# Patient Record
Sex: Female | Born: 1970 | Race: White | Hispanic: No | Marital: Married | Smoking: Never smoker
Health system: Southern US, Community
[De-identification: ages and names within clinical notes are randomized; demographics above are authoritative.]

## PROBLEM LIST (undated history)

## (undated) DIAGNOSIS — D649 Anemia, unspecified: Secondary | ICD-10-CM

## (undated) DIAGNOSIS — T7840XA Allergy, unspecified, initial encounter: Secondary | ICD-10-CM

## (undated) DIAGNOSIS — J309 Allergic rhinitis, unspecified: Secondary | ICD-10-CM

## (undated) HISTORY — DX: Allergic rhinitis, unspecified: J30.9

## (undated) HISTORY — DX: Anemia, unspecified: D64.9

## (undated) HISTORY — DX: Allergy, unspecified, initial encounter: T78.40XA

---

## 1992-08-26 HISTORY — PX: BREAST ENHANCEMENT SURGERY: SHX7

## 2005-11-06 ENCOUNTER — Other Ambulatory Visit: Admission: RE | Admit: 2005-11-06 | Discharge: 2005-11-06 | Payer: Self-pay | Admitting: Internal Medicine

## 2007-02-26 ENCOUNTER — Observation Stay (HOSPITAL_COMMUNITY): Admission: RE | Admit: 2007-02-26 | Discharge: 2007-02-26 | Payer: Self-pay | Admitting: Obstetrics and Gynecology

## 2007-03-07 ENCOUNTER — Inpatient Hospital Stay (HOSPITAL_COMMUNITY): Admission: RE | Admit: 2007-03-07 | Discharge: 2007-03-09 | Payer: Self-pay | Admitting: Obstetrics and Gynecology

## 2007-03-07 ENCOUNTER — Encounter (INDEPENDENT_AMBULATORY_CARE_PROVIDER_SITE_OTHER): Payer: Self-pay | Admitting: Obstetrics and Gynecology

## 2007-05-06 ENCOUNTER — Encounter: Admission: RE | Admit: 2007-05-06 | Discharge: 2007-05-06 | Payer: Self-pay | Admitting: Obstetrics and Gynecology

## 2008-02-15 ENCOUNTER — Encounter: Admission: RE | Admit: 2008-02-15 | Discharge: 2008-02-15 | Payer: Self-pay | Admitting: Obstetrics and Gynecology

## 2008-07-15 ENCOUNTER — Ambulatory Visit (HOSPITAL_COMMUNITY): Admission: RE | Admit: 2008-07-15 | Discharge: 2008-07-15 | Payer: Self-pay | Admitting: Specialist

## 2008-12-27 ENCOUNTER — Ambulatory Visit (HOSPITAL_COMMUNITY): Admission: RE | Admit: 2008-12-27 | Discharge: 2008-12-27 | Payer: Self-pay | Admitting: Gynecology

## 2009-02-22 ENCOUNTER — Encounter: Admission: RE | Admit: 2009-02-22 | Discharge: 2009-02-22 | Payer: Self-pay | Admitting: *Deleted

## 2010-01-05 ENCOUNTER — Ambulatory Visit: Payer: Self-pay | Admitting: Obstetrics and Gynecology

## 2010-01-12 ENCOUNTER — Ambulatory Visit: Payer: Self-pay | Admitting: Obstetrics & Gynecology

## 2010-01-19 ENCOUNTER — Ambulatory Visit: Payer: Self-pay | Admitting: Obstetrics & Gynecology

## 2010-01-26 ENCOUNTER — Inpatient Hospital Stay (HOSPITAL_COMMUNITY): Admission: AD | Admit: 2010-01-26 | Discharge: 2010-01-29 | Payer: Self-pay | Admitting: Obstetrics and Gynecology

## 2010-01-26 ENCOUNTER — Encounter (INDEPENDENT_AMBULATORY_CARE_PROVIDER_SITE_OTHER): Payer: Self-pay | Admitting: Obstetrics and Gynecology

## 2010-01-26 ENCOUNTER — Ambulatory Visit: Payer: Self-pay | Admitting: Obstetrics and Gynecology

## 2010-02-23 LAB — CONVERTED CEMR LAB: Pap Smear: NORMAL

## 2010-07-11 ENCOUNTER — Ambulatory Visit: Payer: Self-pay | Admitting: Family Medicine

## 2010-07-11 DIAGNOSIS — J309 Allergic rhinitis, unspecified: Secondary | ICD-10-CM | POA: Insufficient documentation

## 2010-07-11 DIAGNOSIS — J209 Acute bronchitis, unspecified: Secondary | ICD-10-CM | POA: Insufficient documentation

## 2010-07-11 HISTORY — DX: Allergic rhinitis, unspecified: J30.9

## 2010-07-17 ENCOUNTER — Telehealth: Payer: Self-pay | Admitting: Family Medicine

## 2010-09-12 ENCOUNTER — Telehealth: Payer: Self-pay | Admitting: Family Medicine

## 2010-09-17 ENCOUNTER — Encounter: Payer: Self-pay | Admitting: Gynecology

## 2010-09-25 NOTE — Assessment & Plan Note (Signed)
Summary: new acute only---bronchitis//ccm   Vital Signs:  Patient profile:   40 year old female Menstrual status:  irregular LMP:     04/26/2009 Height:      63.75 inches Weight:      114 pounds BMI:     19.79 Temp:     98.3 degrees F oral Pulse rate:   72 / minute Pulse rhythm:   regular Resp:     12 per minute BP sitting:   100 / 58  (left arm) Cuff size:   regular  Vitals Entered By: Sid Falcon LPN (July 11, 2010 2:27 PM) LMP (date): 04/26/2009     Menstrual Status irregular Enter LMP: 04/26/2009 Last PAP Result normal   History of Present Illness: Patient here as a new patient to establish care. Here with acute issue 2 week history of bronchitis symptoms. Cough occasionally productive of greenish sputum. Has taken some Mucinex. Has history of mild intermittent asthma. Question of some wheezing. Also has some sinus pressure maxillary sinus region. No fever. Has used Nasonex in the past for seasonal allergies.  She takesover-the-counter medications and supplements and birth control pill. No known allergies.  Family history is that of her father having stroke history as well as hypertension and laryngeal cancer.  Preventive Screening-Counseling & Management  Alcohol-Tobacco     Smoking Status: never  Caffeine-Diet-Exercise     Does Patient Exercise: yes  Allergies (verified): No Known Drug Allergies  Past History:  Family History: Last updated: 07/11/2010 Family History of Alcoholism/Addiction, grandparent colon polyps, parents Family History High cholesterol Family History Hypertension  father Family History of Stroke   father Laryngeal CA father  Social History: Last updated: 07/11/2010 Occupation:  Automotive engineer Married Never Smoked Alcohol use-yes Regular exercise-yes 2 pregnancies 3 live births, (twins)  Risk Factors: Exercise: yes (07/11/2010)  Risk Factors: Smoking Status: never (07/11/2010)  Past Medical  History: anemia asthma hx Hay fever, allergies  Past Surgical History: Caesarean section 2008, 2011 Breast implants 1994 PMH-FH-SH reviewed for relevance  Family History: Family History of Alcoholism/Addiction, grandparent colon polyps, parents Family History High cholesterol Family History Hypertension  father Family History of Stroke   father Laryngeal CA father  Social History: Occupation:  Automotive engineer Married Never Smoked Alcohol use-yes Regular exercise-yes 2 pregnancies 3 live births, (twins) Smoking Status:  never Occupation:  employed Does Patient Exercise:  yes  Review of Systems      See HPI  Physical Exam  General:  Well-developed,well-nourished,in no acute distress; alert,appropriate and cooperative throughout examination Ears:  External ear exam shows no significant lesions or deformities.  Otoscopic examination reveals clear canals, tympanic membranes are intact bilaterally without bulging, retraction, inflammation or discharge. Hearing is grossly normal bilaterally. Mouth:  Oral mucosa and oropharynx without lesions or exudates.  Teeth in good repair. Neck:  No deformities, masses, or tenderness noted. Lungs:  patient has some mild diffuse expiratory wheezes in both lung bases. No rales. No retractions Heart:  Normal rate and regular rhythm. S1 and S2 normal without gallop, murmur, click, rub or other extra sounds. Extremities:  No clubbing, cyanosis, edema, or deformity noted with normal full range of motion of all joints.     Impression & Recommendations:  Problem # 1:  ACUTE BRONCHITIS (ICD-466.0) probably viral.  refill proventil.  consider brief pred taper and antibiotic if no better in several days. Her updated medication list for this problem includes:    Proventil Hfa 108 (90 Base) Mcg/act Aers (Albuterol sulfate) .Marland KitchenMarland KitchenMarland KitchenMarland Kitchen 2  sprays q 4 hours as needed cough and wheeze  Problem # 2:  ALLERGIC RHINITIS (ICD-477.9) refill Nasonex. Her  updated medication list for this problem includes:    Nasonex 50 Mcg/act Susp (Mometasone furoate) .Marland Kitchen... 2 sprays per nostril once daily as needed  Complete Medication List: 1)  One Daily Womens Tabs (Multiple vitamins-minerals) .... Once daily 2)  Fish Oil 1000 Mg Caps (Omega-3 fatty acids) .... Once daily 3)  Vitamin D 400 Unit Caps (Cholecalciferol) .... Once daily 4)  Birth Control Pil  5)  Nasonex 50 Mcg/act Susp (Mometasone furoate) .... 2 sprays per nostril once daily as needed 6)  Proventil Hfa 108 (90 Base) Mcg/act Aers (Albuterol sulfate) .... 2 sprays q 4 hours as needed cough and wheeze  Patient Instructions: 1)  Acute Bronchitis symptoms for less then 10 days are not  helped by antibiotics. Take over the counter cough medications. Call if no improvement in 5-7 days, sooner if increasing cough, fever, or new symptoms ( shortness of breath, chest pain) .  2)  Continue Mucinex and Netti pot. Prescriptions: PROVENTIL HFA 108 (90 BASE) MCG/ACT AERS (ALBUTEROL SULFATE) 2 sprays q 4 hours as needed cough and wheeze  #1 x 2   Entered and Authorized by:   Evelena Peat MD   Signed by:   Evelena Peat MD on 07/11/2010   Method used:   Electronically to        [facility] [phone]* (retail)       [address]       [place], [place]  [address]       Ph: [phone] or [phone]       Fax: [phone]   RxID:   [id number] NASONEX 50 MCG/ACT SUSP (MOMETASONE FUROATE) 2 sprays per nostril once daily as needed  #1 x 11   Entered and Authorized by:   Evelena Peat MD   Signed by:   Evelena Peat MD on 07/11/2010   Method used:   Electronically to        [facility] [phone]* (retail)       [address]       [place], [place]  [address]       Ph: [phone] or [phone]       Fax: [phone]   RxID:   [id number]    Orders Added: 1)  New Patient Level II [99202]    Preventive Care  Screening  Last Tetanus Booster:    Date:  02/23/2010    Results:  Td   Pap Smear:    Date:  02/23/2010    Results:  normal

## 2010-09-25 NOTE — Progress Notes (Signed)
Summary: zpack request  Phone Note Call from Patient Call back at Home Phone [phone]   Caller: Patient Call For: Evelena Peat MD Summary of Call: Pt was seen on 07-11-2010.She  is no better, pt is requesting a zpack(bronchitis) [facility] [address] [phone] Initial call taken by: Heron Sabins,  July 17, 2010 9:01 AM  Follow-up for Phone Call        OK to call in Z-pack use as directed. Follow-up by: Evelena Peat MD,  July 17, 2010 9:04 AM  Additional Follow-up for Phone Call Additional follow up Details #1::        Pt informed Additional Follow-up by: Sid Falcon LPN,  July 17, 2010 11:13 AM    New/Updated Medications: ZITHROMAX Z-PAK 250 MG TABS (AZITHROMYCIN) use as directed Prescriptions: ZITHROMAX Z-PAK 250 MG TABS (AZITHROMYCIN) use as directed  #6 x 0   Entered by:   Sid Falcon LPN   Authorized by:   Evelena Peat MD   Signed by:   Sid Falcon LPN on 62/95/2841   Method used:   Electronically to        [facility] [phone]* (retail)       [address]       [place], [place]  [address]       Ph: [phone] or [phone]       Fax: [phone]   RxID:   [id number]

## 2010-09-27 NOTE — Progress Notes (Signed)
  Phone Note Call from Patient   Caller: Patient Call For: Evelena Peat MD Summary of Call: [facility] 90 day supply of Flovent [phone] used old one and it worked well, would like it called in. Initial call taken by: Lynann Beaver CMA AAMA,  September 12, 2010 4:34 PM  Follow-up for Phone Call        need to know dose-44,88,etc  [address] CMA AAMA  September 13, 2010 8:27 AM  Follow-up by: Evelena Peat MD,  September 12, 2010 5:17 PM  Additional Follow-up for Phone Call Additional follow up Details #1::        Flovent 44 micrograms 2 puffs two times a day refil times 6 Additional Follow-up by: Evelena Peat MD,  September 13, 2010 1:16 PM    New/Updated Medications: FLOVENT HFA 44 MCG/ACT AERO (FLUTICASONE PROPIONATE  HFA) 2 puffs bid Prescriptions: FLOVENT HFA 44 MCG/ACT AERO (FLUTICASONE PROPIONATE  HFA) 2 puffs bid  #3 x 3   Entered by:   Lynann Beaver CMA AAMA   Authorized by:   Evelena Peat MD   Signed by:   Lynann Beaver CMA AAMA on 09/13/2010   Method used:   Printed then faxed to ...       [facility] (mail-order)       [address] [place], [place]  [address]       Ph: [phone]       Fax: [phone]   RxID:   [id number]

## 2010-11-12 LAB — CBC
Hemoglobin: 12.5 g/dL (ref 12.0–15.0)
MCHC: 35.5 g/dL (ref 30.0–36.0)
MCHC: 35.5 g/dL (ref 30.0–36.0)
MCV: 100.7 fL — ABNORMAL HIGH (ref 78.0–100.0)
RBC: 3.5 MIL/uL — ABNORMAL LOW (ref 3.87–5.11)
RDW: 14.1 % (ref 11.5–15.5)
RDW: 14.3 % (ref 11.5–15.5)
WBC: 8.5 10*3/uL (ref 4.0–10.5)

## 2010-11-12 LAB — DIC (DISSEMINATED INTRAVASCULAR COAGULATION)PANEL
D-Dimer, Quant: >20 ug/mL-FEU — ABNORMAL HIGH (ref 0.00–0.48)
Smear Review: NONE SEEN
aPTT: 29 seconds (ref 24–37)

## 2010-11-12 LAB — RPR: RPR Ser Ql: NONREACTIVE

## 2010-12-27 ENCOUNTER — Ambulatory Visit: Payer: Self-pay | Admitting: Family Medicine

## 2010-12-31 ENCOUNTER — Encounter: Payer: Self-pay | Admitting: Family Medicine

## 2010-12-31 ENCOUNTER — Ambulatory Visit (INDEPENDENT_AMBULATORY_CARE_PROVIDER_SITE_OTHER): Payer: 59 | Admitting: Family Medicine

## 2010-12-31 VITALS — BP 110/60 | Temp 98.0°F | Ht 64.0 in | Wt 115.0 lb

## 2010-12-31 DIAGNOSIS — R05 Cough: Secondary | ICD-10-CM

## 2010-12-31 DIAGNOSIS — J309 Allergic rhinitis, unspecified: Secondary | ICD-10-CM

## 2010-12-31 DIAGNOSIS — R059 Cough, unspecified: Secondary | ICD-10-CM

## 2010-12-31 MED ORDER — MONTELUKAST SODIUM 10 MG PO TABS: 10.0000 mg | ORAL_TABLET | Freq: Every day | ORAL | Status: DC

## 2010-12-31 NOTE — Progress Notes (Signed)
  Subjective:    Patient ID: Tasha Brooks, female    DOB: 09-14-1970, 40 y.o.   MRN: [id number]  HPI Patient seen with a cough almost 2 month duration. Nonsmoker. History of asthma which apparently has been mild and intermittent. Started back Flovent 44 mcg one puff twice daily about 6 weeks ago. Some postnasal drip symptoms. Using Nasonex and occasional Claritin-D. Denies dyspnea. No chest pain. No pleuritic pain. Denies hemoptysis, fever, or chills. No GERD symptoms. No other prescription medications.   Review of Systems  Constitutional: Negative for fever, chills, activity change, appetite change, fatigue and unexpected weight change.  Respiratory: Positive for cough and wheezing. Negative for shortness of breath and stridor.   Cardiovascular: Negative for chest pain, palpitations and leg swelling.       Objective:   Physical Exam  Constitutional: She appears well-developed and well-nourished.  HENT:  Right Ear: External ear normal.  Left Ear: External ear normal.  Mouth/Throat: Oropharynx is clear and moist. No oropharyngeal exudate.  Neck: Neck supple. No thyromegaly present.  Cardiovascular: Normal rate, regular rhythm and normal heart sounds.   Pulmonary/Chest: Effort normal and breath sounds normal. No respiratory distress. She has no wheezes. She has no rales.  Musculoskeletal: She exhibits no edema.  Lymphadenopathy:    She has no cervical adenopathy.          Assessment & Plan:  Cough probably secondary to allergic postnasal drip and/or asthma. Titrate Flovent 44 mcg 2 puffs twice daily. Add Singulair 10 mg daily. Continue Nasonex and over-the-counter antihistamine. Consider chest x-ray if no better in 2 weeks

## 2010-12-31 NOTE — Patient Instructions (Signed)
Increase Flovent to 2 puffs twice daily. Be in touch in 2 weeks if no better.

## 2011-01-08 NOTE — Op Note (Signed)
NAMEGLADIS, Tasha Brooks              ACCOUNT NO.:  [id number]   MEDICAL RECORD NO.:  [id number]          PATIENT TYPE:  INP   LOCATION:  9113                          FACILITY:  [facility]   PHYSICIAN:  Maxie Better, M.D.DATE OF BIRTH:  July 04, 1971   DATE OF PROCEDURE:  03/07/2007  DATE OF DISCHARGE:                               OPERATIVE REPORT   PREOPERATIVE DIAGNOSIS:  Breech presentation, term gestation.   PROCEDURES:  primary cesarean section, Kerr hysterotomy.   POSTOPERATIVE DIAGNOSIS:  Incomplete breech presentation, term  gestation.   ANESTHESIA:  Spinal.   SURGEON:  Maxie Better, M.D.   ASSISTANT:  None.   INDICATIONS:  40 year old gravida 1, para 0 female at term with a breech  presentation now presents for primary cesarean section.  The patient had  a successful external cephalic version on July 3, and subsequently  converted back to a breech presentation.  The patient now presents for  surgical management.  Risk and benefit procedure was explained to the  patient and husband, consent had been signed, the patient was  transferred to the operating room.   PROCEDURE:  Under adequate spinal anesthesia the patient is placed in  the supine position with a left lateral tilt.  She was sterilely prepped  and draped in usual fashion.  Indwelling Foley catheter was sterilely  placed.  Quarter percent Marcaine was injected along planned  Pfannenstiel skin incision.  Pfannenstiel skin incision was then made,  carried down to the rectus fascia.  Rectus fascia was opened  transversely.  The rectus fascia was then bluntly and sharply dissected  off the rectus muscle in superior inferior fashion.  The rectus muscles  split in midline.  The parietal peritoneum entered bluntly and extended.  The vesicouterine peritoneum was opened transversely.  The bladder was  bluntly dissected off the lower uterine segment and displaced  inferiorly.  The bladder retractor.  A curvilinear low  transverse  uterine incision was then made and extended with bandage scissors.  Artificial rupture of membranes.  Copious clear fluid was noted, fetus  with back up incomplete presentation was delivered using the usual  breech maneuver. The baby was bulb suctioned on abdomen and the cord was  clamped, cut. The baby was transferred to the awaiting pediatrician  assigned Apgars of 9 and 10 at 1 and 5 minutes.  The placenta was  manually removed sent to pathology.  Uterine cavity was cleaned of  debris.  Uterine incision had no extension and was closed in two layers,  first layer 0 Monocryl running lock stitch second layer was imbricated 0  Monocryl suture.  The uterine contour was normal on inspection, normal  tubes and ovaries were noted bilaterally.  The abdomen was copiously  irrigated, suctioned of debris.  The parietal peritoneum was then closed  with 2-0 Vicryl suture.  The rectus fascia was closed with 0 Vicryl x2.  The subcutaneous areas approximated interrupted 2-0 plain sutures and  skin approximated using Ethicon staples.   SPECIMENS:  Placenta sent to pathology.  Estimated blood loss was 800  mL.  Intraoperative fluid was 2300 mL crystalloid.  Urine output was 150  mL clear yellow urine.  Sponge and instrument counts x2 was correct.  Complications none.  The patient tolerated the procedure well and was  transferred to recovery in stable condition.      Maxie Better, M.D.  Electronically Signed     /MEDQ  D:  03/07/2007  T:  03/08/2007  Job:  [id number]

## 2011-01-11 NOTE — Discharge Summary (Signed)
Tasha Brooks, Tasha Brooks              ACCOUNT NO.:  [id number]   MEDICAL RECORD NO.:  [id number]          PATIENT TYPE:  INP   LOCATION:  9113                          FACILITY:  [facility]   PHYSICIAN:  Maxie Better, M.D.DATE OF BIRTH:  [birth year]   DATE OF ADMISSION:  03/07/2007  DATE OF DISCHARGE:  03/09/2007                               DISCHARGE SUMMARY   ADMISSION DIAGNOSIS:  Breech presentation, term gestation.   DISCHARGE DIAGNOSIS:  Incomplete breech presentation, term gestation  delivered.   PROCEDURE:  Primary cesarean section, Kerr hysterotomy.   HISTORY OF PRESENT ILLNESS:  40 year old gravida 1, para 0 married white  female conceived by in vitro fertilization now at 40 weeks presented for  a primary cesarean section secondary to breech presentation.  The  patient underwent a successful external cephalic version on 07/03,  however, the baby converted back to breech again. The version was  complicated by transient bradycardia which subsequently resolved.   HOSPITAL COURSE:  The patient was admitted to [facility].  She was  taken to the operating room where she underwent a primary low transverse  cesarean section.  The procedure resulted in delivery of a 6 pounds 1  ounce live female, Apgars of 9/10. The patient had an uncomplicated  postoperative course.  Her CBC on postop day #1 showed a hemoglobin of  9.4, hematocrit of 27, white count of 12.3, platelet count of 153.  The  patient requested to go home on postop day #2, she had no evidence of  infection.  Her incision was without erythema, induration, she was  without any symptoms from hemoglobin. The patient was felt to be okay to  be discharged home.  Disposition is home.  Condition stable.   DISCHARGE MEDICATIONS:  1. Ibuprofen 800 mg every 8 hours p.r.n. pain.  2. Percocet tablet one every 4-6 hours p.r.n. pain.  3. Iron tablets one p.o. b.i.d.  4. Prenatal vitamins one p.o. daily.   DISCHARGE INSTRUCTIONS:   Per the postpartum booklet given.   FOLLOW-UP APPOINTMENTS:  At [facility] for staple removal on March 11, 2007 and 6 weeks postpartum visit      Maxie Better, M.D.  Electronically Signed     [place]/MEDQ  D:  04/05/2007  T:  04/05/2007  Job:  [id number]

## 2011-01-24 ENCOUNTER — Encounter: Payer: Self-pay | Admitting: Family Medicine

## 2011-01-24 ENCOUNTER — Ambulatory Visit (INDEPENDENT_AMBULATORY_CARE_PROVIDER_SITE_OTHER): Payer: 59 | Admitting: Family Medicine

## 2011-01-24 VITALS — BP 100/60 | Temp 98.5°F

## 2011-01-24 DIAGNOSIS — J309 Allergic rhinitis, unspecified: Secondary | ICD-10-CM

## 2011-01-24 DIAGNOSIS — R05 Cough: Secondary | ICD-10-CM

## 2011-01-24 DIAGNOSIS — R053 Chronic cough: Secondary | ICD-10-CM

## 2011-01-24 DIAGNOSIS — R059 Cough, unspecified: Secondary | ICD-10-CM

## 2011-01-24 MED ORDER — METHYLPREDNISOLONE ACETATE 80 MG/ML IJ SUSP
80.0000 mg | Freq: Once | INTRAMUSCULAR | Status: AC
  Administered 2011-01-24: 80 mg via INTRAMUSCULAR

## 2011-01-24 MED ORDER — AZELASTINE HCL 0.1 % NA SOLN: 1.0000 | Freq: Two times a day (BID) | NASAL | Status: DC

## 2011-01-24 NOTE — Progress Notes (Signed)
  Subjective:    Patient ID: Tasha Brooks, female    DOB: [birth year], 40 y.o.   MRN: [id number]  HPI Patient seen with persistent cough. Refer to prior note. She is nonsmoker. History of allergies and asthma. Last visit we had her increase Flovent to 2 puffs twice daily and addition of Singulair. Singulair did not seem to help. She does have less productive cough but still some postnasal drip symptoms and dry cough. Still exercising. No dyspnea. Denies any fever, chills, appetite change, weight loss. No GERD symptoms. Albuterol helps temporarily. Also some relief with Claritin-D but had some insomnia.  Not using any mentholated products.   Review of Systems  Constitutional: Negative for fever, chills, appetite change, fatigue and unexpected weight change.  HENT: Positive for postnasal drip. Negative for sore throat, rhinorrhea and voice change.   Respiratory: Positive for cough. Negative for chest tightness, shortness of breath and wheezing.   Cardiovascular: Negative for chest pain, palpitations and leg swelling.  Neurological: Negative for headaches.  Hematological: Negative for adenopathy.       Objective:   Physical Exam  Constitutional: She appears well-developed and well-nourished.  HENT:  Right Ear: External ear normal.  Left Ear: External ear normal.  Mouth/Throat: Oropharynx is clear and moist. No oropharyngeal exudate.  Neck: Neck supple.  Cardiovascular: Normal rate, regular rhythm and normal heart sounds.   No murmur heard. Pulmonary/Chest: Effort normal and breath sounds normal. No respiratory distress. She has no wheezes. She has no rales.  Musculoskeletal: She exhibits no edema.  Lymphadenopathy:    She has no cervical adenopathy.          Assessment & Plan:  Chronic cough. Patient has history of reactive airway problems but no evidence for any wheezing at this time. She has some persistent postnasal drip symptoms. Discontinue Singulair. Continue Nasonex and  Flovent. Add Astelin nasal. Nighttime use of brompheniramine or chlorpheniramine. Depo-Medrol 80 mg IM given. Chest x-ray if no better in one to 2 weeks

## 2011-01-24 NOTE — Patient Instructions (Signed)
Discontinue Singulair Continue Flovent 2 puffs twice daily Continue Nasonex Add Astelin nasal 1 spray per nostril twice daily Consider nighttime use of over-the-counter brompheniramine or chlorpheniramine for postnasal drip symptoms Touch base in 1-2 weeks if cough not improved.

## 2011-01-24 NOTE — Progress Notes (Signed)
Addended by: Melchor Amour on: 01/24/2011 05:07 PM   Modules accepted: Orders

## 2011-03-14 ENCOUNTER — Other Ambulatory Visit (INDEPENDENT_AMBULATORY_CARE_PROVIDER_SITE_OTHER): Payer: 59

## 2011-03-14 DIAGNOSIS — Z0000 Encounter for general adult medical examination without abnormal findings: Secondary | ICD-10-CM

## 2011-03-14 LAB — URINALYSIS, ROUTINE W REFLEX MICROSCOPIC
Bilirubin Urine: NEGATIVE
Ketones, ur: NEGATIVE
Nitrite: NEGATIVE
Specific Gravity, Urine: 1.015 (ref 1.000–1.030)
Urine Glucose: NEGATIVE
Urobilinogen, UA: 0.2 (ref 0.0–1.0)
pH: 6 (ref 5.0–8.0)

## 2011-03-14 LAB — BASIC METABOLIC PANEL
CO2: 28 mEq/L (ref 19–32)
GFR: 72.71 mL/min (ref >60.00)
Glucose, Bld: 90 mg/dL (ref 70–99)

## 2011-03-14 LAB — HEPATIC FUNCTION PANEL
ALT: 20 U/L (ref 0–35)
Bilirubin, Direct: 0.1 mg/dL (ref 0.0–0.3)
Total Bilirubin: 0.8 mg/dL (ref 0.3–1.2)

## 2011-03-14 LAB — CBC WITH DIFFERENTIAL/PLATELET
Basophils Absolute: 0 10*3/uL (ref 0.0–0.1)
Basophils Relative: 0.2 % (ref 0.0–3.0)
Hemoglobin: 13.1 g/dL (ref 12.0–15.0)
Lymphocytes Relative: 28.9 % (ref 12.0–46.0)
MCHC: 34.3 g/dL (ref 30.0–36.0)
MCV: 96.2 fl (ref 78.0–100.0)
Monocytes Absolute: 0.3 10*3/uL (ref 0.1–1.0)
Neutro Abs: 4.2 10*3/uL (ref 1.4–7.7)
Neutrophils Relative %: 65.8 % (ref 43.0–77.0)
RDW: 12.9 % (ref 11.5–14.6)

## 2011-03-14 LAB — LIPID PANEL
Cholesterol: 221 mg/dL — ABNORMAL HIGH (ref 0–200)
Total CHOL/HDL Ratio: 3
Triglycerides: 122 mg/dL (ref 0.0–149.0)

## 2011-03-22 ENCOUNTER — Encounter: Payer: 59 | Admitting: Family Medicine

## 2011-03-22 ENCOUNTER — Encounter: Payer: Self-pay | Admitting: Family Medicine

## 2011-03-22 ENCOUNTER — Ambulatory Visit (INDEPENDENT_AMBULATORY_CARE_PROVIDER_SITE_OTHER): Payer: 59 | Admitting: Family Medicine

## 2011-03-22 VITALS — BP 90/60 | HR 60 | Temp 98.7°F | Resp 12

## 2011-03-22 DIAGNOSIS — Z0000 Encounter for general adult medical examination without abnormal findings: Secondary | ICD-10-CM

## 2011-03-22 NOTE — Progress Notes (Signed)
  Subjective:    Patient ID: Tasha Brooks, female    DOB: 1970/12/28, 40 y.o.   MRN: [id number]  HPI Patient seen for well visit. She sees gynecologist yearly and has appointment next month.  Past medical hsitory reviewed. She has history of mild persistent asthma and allergic rhinitis. Recent chronic cough resolved after getting back on regular use of Flovent. She exercises fairly regularly. Tetanus up-to-date.  Past medical history, social history, and family history reviewed  Past Medical History  Diagnosis Date  . ALLERGIC RHINITIS 07/11/2010  . Anemia   . Asthma    Past Surgical History  Procedure Date  . Cesarean section     2008, 2011  . Breast enhancement surgery 1994    reports that she has never smoked. She does not have any smokeless tobacco history on file. Her alcohol and drug histories not on file. family history includes Cancer in her father; Hypertension in her father; and Stroke in her father. No Known Allergies    Review of Systems  Constitutional: Negative for fever, activity change, appetite change and fatigue.  HENT: Negative for hearing loss, ear pain, sore throat and trouble swallowing.   Eyes: Negative for visual disturbance.  Respiratory: Negative for cough and shortness of breath.   Cardiovascular: Negative for chest pain and palpitations.  Gastrointestinal: Negative for abdominal pain, diarrhea, constipation and blood in stool.  Genitourinary: Negative for dysuria and hematuria.  Musculoskeletal: Negative for myalgias, back pain and arthralgias.  Skin: Negative for rash.  Neurological: Negative for dizziness, syncope and headaches.  Hematological: Negative for adenopathy.  Psychiatric/Behavioral: Negative for confusion and dysphoric mood.       Objective:   Physical Exam  Constitutional: She is oriented to person, place, and time. She appears well-developed and well-nourished.  HENT:  Head: Normocephalic and atraumatic.  Eyes: EOM are normal.  Pupils are equal, round, and reactive to light.  Neck: Normal range of motion. Neck supple. No thyromegaly present.  Cardiovascular: Normal rate, regular rhythm and normal heart sounds.   No murmur heard. Pulmonary/Chest: Breath sounds normal. No respiratory distress. She has no wheezes. She has no rales.  Abdominal: Soft. Bowel sounds are normal. She exhibits no distension and no mass. There is no tenderness. There is no rebound and no guarding.  Genitourinary:       Per gyn  Musculoskeletal: Normal range of motion. She exhibits no edema.  Lymphadenopathy:    She has no cervical adenopathy.  Neurological: She is alert and oriented to person, place, and time. She displays normal reflexes. No cranial nerve deficit.  Skin: No rash noted.  Psychiatric: She has a normal mood and affect. Her behavior is normal. Judgment and thought content normal.          Assessment & Plan:   Healthy 40 year old female. Continue GYN followup. Labs reviewed with patient and all favorable. Continue regular exercise.

## 2011-04-09 ENCOUNTER — Telehealth: Payer: Self-pay | Admitting: Family Medicine

## 2011-04-09 NOTE — Telephone Encounter (Signed)
Detailed message left on VM to please fax over the 2nd page that Insurance Co did not get, we will fill out and fax back asap

## 2011-04-09 NOTE — Telephone Encounter (Signed)
Pt came in for cpx and left a form to complete and send to insurance company. Insurance company only received one page and it was 2. Pt requesting form be re-faxed to insurance company.

## 2011-06-11 LAB — CBC
HCT: 27 — ABNORMAL LOW
HCT: 35.9 — ABNORMAL LOW
Hemoglobin: 12.3
Hemoglobin: 9.4 — ABNORMAL LOW
MCHC: 34.3
MCHC: 34.7
MCV: 98.7
MCV: 98.9
Platelets: 153
RBC: 3.64 — ABNORMAL LOW
RDW: 13.3
WBC: 7.6

## 2011-06-11 LAB — RPR: RPR Ser Ql: NONREACTIVE

## 2011-07-23 ENCOUNTER — Other Ambulatory Visit: Payer: Self-pay | Admitting: Family Medicine

## 2011-10-15 ENCOUNTER — Telehealth: Payer: Self-pay | Admitting: *Deleted

## 2011-10-15 MED ORDER — MOMETASONE FUROATE 50 MCG/ACT NA SUSP: 2.0000 | Freq: Every day | NASAL | Status: AC

## 2011-10-15 NOTE — Telephone Encounter (Signed)
I called pt, she requested 3 inhalers to supply her for 3 months through [facility]

## 2011-10-15 NOTE — Telephone Encounter (Signed)
Pt would like to speak to Seffner about sending her meds to [facility] for 90 day supplies. [phone]. Please call pt first.

## 2011-10-18 ENCOUNTER — Telehealth: Payer: Self-pay | Admitting: *Deleted

## 2011-10-18 MED ORDER — AZITHROMYCIN 250 MG PO TABS: ORAL_TABLET | ORAL | Status: AC

## 2011-10-18 NOTE — Telephone Encounter (Signed)
Notified pt. 

## 2011-10-18 NOTE — Telephone Encounter (Signed)
Go ahead and fill Z-pack (5 days) and need to evaluate if not improving soon,

## 2011-10-18 NOTE — Telephone Encounter (Signed)
Pt has 2 children that are at home on antibiotics for viral illnesses.  She is having sinus symptoms, no fever, non productive cough, and would like to have a Zpack.  Is working today.

## 2011-11-04 ENCOUNTER — Telehealth: Payer: Self-pay | Admitting: Family Medicine

## 2011-11-04 NOTE — Telephone Encounter (Signed)
Needs to be seen

## 2011-11-04 NOTE — Telephone Encounter (Signed)
Pt informed, she will schedule OV

## 2011-11-04 NOTE — Telephone Encounter (Signed)
Pt is still having bronchitis symptoms recently was prescribed a z-pack but did not clear it up pt is hoping to get a stronger prescription

## 2011-11-04 NOTE — Telephone Encounter (Signed)
Pt OV 3/1, finished Z-pack 3/5, "felt a little better after med, then it all came back, still coughing a lot, has been taking mucinex and robutussin and just cannot shake this mess".

## 2011-11-05 ENCOUNTER — Encounter: Payer: Self-pay | Admitting: Family

## 2011-11-05 ENCOUNTER — Ambulatory Visit (INDEPENDENT_AMBULATORY_CARE_PROVIDER_SITE_OTHER): Payer: 59 | Admitting: Family

## 2011-11-05 VITALS — BP 90/60 | Temp 98.6°F | Wt 114.0 lb

## 2011-11-05 DIAGNOSIS — J019 Acute sinusitis, unspecified: Secondary | ICD-10-CM

## 2011-11-05 DIAGNOSIS — R059 Cough, unspecified: Secondary | ICD-10-CM

## 2011-11-05 DIAGNOSIS — R05 Cough: Secondary | ICD-10-CM

## 2011-11-05 MED ORDER — PREDNISONE 20 MG PO TABS: ORAL_TABLET | ORAL | Status: AC

## 2011-11-05 NOTE — Progress Notes (Signed)
Subjective:    Patient ID: Tasha Brooks, female    DOB: [birth year], 41 y.o.   MRN: [id number]  HPI Comments: C/o sinus drainage, productive cough expectorating yellow-green mucus, and hacking cough x 2 months. Was treated March 1 with z-pack with some relief. Denies sorethroat, fever, chills, nausea, vomiting, or headache.   Sinusitis Associated symptoms include congestion. Pertinent negatives include no ear pain, neck pain, sinus pressure, sneezing or sore throat.      Review of Systems  Constitutional: Negative.   HENT: Positive for congestion and postnasal drip. Negative for hearing loss, ear pain, sore throat, facial swelling, sneezing, trouble swallowing, neck pain, neck stiffness, voice change, sinus pressure, tinnitus and ear discharge.   Eyes: Negative.   Respiratory: Negative.   Cardiovascular: Negative.    Past Medical History  Diagnosis Date  . ALLERGIC RHINITIS 07/11/2010  . Anemia   . Asthma     History   Social History  . Marital Status: Married    Spouse Name: N/A    Number of Children: N/A  . Years of Education: N/A   Occupational History  . Not on file.   Social History Main Topics  . Smoking status: Never Smoker   . Smokeless tobacco: Not on file  . Alcohol Use: Not on file  . Drug Use: Not on file  . Sexually Active: Not on file   Other Topics Concern  . Not on file   Social History Narrative  . No narrative on file    Past Surgical History  Procedure Date  . Cesarean section     2008, 2011  . Breast enhancement surgery 1994    Family History  Problem Relation Age of Onset  . Stroke Father   . Hypertension Father   . Cancer Father     laryngeal    No Known Allergies  Current Outpatient Prescriptions on File Prior to Visit  Medication Sig Dispense Refill  . albuterol (PROVENTIL HFA;VENTOLIN HFA) 108 (90 BASE) MCG/ACT inhaler Inhale 2 puffs into the lungs every 6 (six) hours as needed.        Marland Kitchen azelastine (ASTELIN) 137  MCG/SPRAY nasal spray Place 1 spray into the nose 2 (two) times daily. Use in each nostril as directed  30 mL  12  . calcium-vitamin D 250-100 MG-UNIT per tablet Take 1 tablet by mouth 2 (two) times daily.        . fish oil-omega-3 fatty acids 1000 MG capsule Take 2 g by mouth daily.        . fluticasone (FLOVENT HFA) 44 MCG/ACT inhaler Inhale 1 puff into the lungs 2 (two) times daily.        . mometasone (NASONEX) 50 MCG/ACT nasal spray Place 2 sprays into the nose daily.  17 g  3  . Multiple Vitamins-Minerals (WOMENS MULTI PO) Take by mouth daily.        Marland Kitchen SINGULAIR 10 MG tablet         BP 90/60  Temp(Src) 98.6 F (37 C) (Oral)  Wt 114 lb (51.71 kg)chart     Objective:   Physical Exam  Constitutional: She is oriented to person, place, and time. She appears well-developed and well-nourished. No distress.  HENT:  Right Ear: External ear normal.  Left Ear: External ear normal.  Nose: Nose normal.  Mouth/Throat: Oropharyngeal exudate present.  Eyes: Right eye exhibits no discharge. Left eye exhibits no discharge.  Cardiovascular: Normal rate, regular rhythm, normal heart sounds and intact distal pulses.  Exam reveals no friction rub.   No murmur heard. Pulmonary/Chest: Effort normal and breath sounds normal. No respiratory distress. She has no wheezes. She has no rales. She exhibits no tenderness.  Musculoskeletal: Normal range of motion.  Neurological: She is alert and oriented to person, place, and time.  Skin: Skin is warm and dry. She is not diaphoretic.          Assessment & Plan:  Assessment: Sinusitis, Cough  Plan: Rest and increase po fluids, prednisone, teaching handouts provided on diagnosis and medications

## 2011-11-05 NOTE — Patient Instructions (Signed)

## 2011-11-21 ENCOUNTER — Other Ambulatory Visit: Payer: Self-pay | Admitting: Family Medicine

## 2011-11-21 NOTE — Telephone Encounter (Signed)
Pt is requesting new rx  fl[facility]ent [facility] [address] also please sent flovent #3 to [facility]

## 2012-01-22 ENCOUNTER — Other Ambulatory Visit: Payer: Self-pay | Admitting: Obstetrics and Gynecology

## 2012-01-22 DIAGNOSIS — N63 Unspecified lump in unspecified breast: Secondary | ICD-10-CM

## 2012-01-23 ENCOUNTER — Other Ambulatory Visit: Payer: Self-pay

## 2012-01-30 ENCOUNTER — Other Ambulatory Visit: Payer: 59

## 2012-03-09 ENCOUNTER — Telehealth: Payer: Self-pay | Admitting: Family Medicine

## 2012-03-09 MED ORDER — FLUTICASONE PROPIONATE HFA 44 MCG/ACT IN AERO: 2.0000 | INHALATION_SPRAY | Freq: Two times a day (BID) | RESPIRATORY_TRACT | Status: DC

## 2012-03-09 NOTE — Telephone Encounter (Signed)
Pt needs refill on Flovent inhaler. [facility] [address], but it is cheaper through mail  Order [facility]. Please send mail order. Pt's [facility] rx ID# is [id number]. Pt's CPX appt is 04-24-12.

## 2012-03-19 ENCOUNTER — Other Ambulatory Visit: Payer: Self-pay | Admitting: *Deleted

## 2012-03-20 ENCOUNTER — Telehealth: Payer: Self-pay | Admitting: Family Medicine

## 2012-03-20 MED ORDER — FLUTICASONE PROPIONATE HFA 44 MCG/ACT IN AERO: 2.0000 | INHALATION_SPRAY | Freq: Two times a day (BID) | RESPIRATORY_TRACT | Status: DC

## 2012-03-20 NOTE — Telephone Encounter (Signed)
Rx filled as requested.

## 2012-03-20 NOTE — Telephone Encounter (Signed)
Patient called stating that her flovent hfa was called in for a 30 day refill and it should have been called into [facility] for a 90 day supply as this would be a lot cheaper. Please assist.

## 2012-04-17 ENCOUNTER — Other Ambulatory Visit: Payer: 59

## 2012-04-23 ENCOUNTER — Other Ambulatory Visit (INDEPENDENT_AMBULATORY_CARE_PROVIDER_SITE_OTHER): Payer: 59

## 2012-04-23 DIAGNOSIS — Z0000 Encounter for general adult medical examination without abnormal findings: Secondary | ICD-10-CM

## 2012-04-23 LAB — LIPID PANEL
Cholesterol: 172 mg/dL (ref 0–200)
LDL Cholesterol: 85 mg/dL (ref 0–99)
Total CHOL/HDL Ratio: 3
Triglycerides: 97 mg/dL (ref 0.0–149.0)
VLDL: 19.4 mg/dL (ref 0.0–40.0)

## 2012-04-23 LAB — HEPATIC FUNCTION PANEL
Alkaline Phosphatase: 30 U/L — ABNORMAL LOW (ref 39–117)
Bilirubin, Direct: 0.1 mg/dL (ref 0.0–0.3)
Total Bilirubin: 0.7 mg/dL (ref 0.3–1.2)

## 2012-04-23 LAB — POCT URINALYSIS DIPSTICK
Nitrite, UA: NEGATIVE
Protein, UA: NEGATIVE
Spec Grav, UA: 1.01
Urobilinogen, UA: 0.2
pH, UA: 6.5

## 2012-04-23 LAB — BASIC METABOLIC PANEL
BUN: 15 mg/dL (ref 6–23)
Calcium: 8.7 mg/dL (ref 8.4–10.5)
Chloride: 103 mEq/L (ref 96–112)
Creatinine, Ser: 0.8 mg/dL (ref 0.4–1.2)

## 2012-04-23 LAB — CBC WITH DIFFERENTIAL/PLATELET
Eosinophils Absolute: 0 10*3/uL (ref 0.0–0.7)
Eosinophils Relative: 0.8 % (ref 0.0–5.0)
Lymphocytes Relative: 33.1 % (ref 12.0–46.0)
MCHC: 33.1 g/dL (ref 30.0–36.0)
MCV: 96.2 fl (ref 78.0–100.0)
Monocytes Absolute: 0.2 10*3/uL (ref 0.1–1.0)
Neutrophils Relative %: 61.3 % (ref 43.0–77.0)
Platelets: 271 10*3/uL (ref 150.0–400.0)
RBC: 3.58 Mil/uL — ABNORMAL LOW (ref 3.87–5.11)
WBC: 4.1 10*3/uL — ABNORMAL LOW (ref 4.5–10.5)

## 2012-04-24 ENCOUNTER — Encounter: Payer: 59 | Admitting: Family Medicine

## 2012-05-08 ENCOUNTER — Encounter: Payer: Self-pay | Admitting: Family Medicine

## 2012-05-08 ENCOUNTER — Ambulatory Visit (INDEPENDENT_AMBULATORY_CARE_PROVIDER_SITE_OTHER): Payer: 59 | Admitting: Family Medicine

## 2012-05-08 VITALS — BP 120/62 | HR 72 | Temp 98.0°F | Resp 12 | Wt 111.0 lb

## 2012-05-08 DIAGNOSIS — Z23 Encounter for immunization: Secondary | ICD-10-CM

## 2012-05-08 DIAGNOSIS — Z0000 Encounter for general adult medical examination without abnormal findings: Secondary | ICD-10-CM

## 2012-05-08 NOTE — Progress Notes (Signed)
  Subjective:    Patient ID: Tasha Brooks, female    DOB: 11-06-1970, 41 y.o.   MRN: [id number]  HPI  Patient seen for complete physical. She sees gynecologist yearly. She has history of mild persistent asthma. This has been well controlled with Flovent that she has that cost issues with his medication. She will like to explore other options. She has not yet flu vaccine. Tetanus is up-to-date. Exercises regularly. Takes calcium and vitamin D supplements. She also takes nasal steroid for allergies. Allergies have been well controlled. She's never smoked.  Past Medical History  Diagnosis Date  . ALLERGIC RHINITIS 07/11/2010  . Anemia   . Asthma    Past Surgical History  Procedure Date  . Cesarean section     2008, 2011  . Breast enhancement surgery 1994    reports that she has never smoked. She does not have any smokeless tobacco history on file. Her alcohol and drug histories not on file. family history includes Cancer in her father; Hypertension in her father; and Stroke in her father. No Known Allergies    Review of Systems  Constitutional: Negative for fever, activity change, appetite change, fatigue and unexpected weight change.  HENT: Negative for hearing loss, ear pain, sore throat and trouble swallowing.   Eyes: Negative for visual disturbance.  Respiratory: Negative for cough and shortness of breath.   Cardiovascular: Negative for chest pain and palpitations.  Gastrointestinal: Negative for abdominal pain, diarrhea, constipation and blood in stool.  Genitourinary: Negative for dysuria and hematuria.  Musculoskeletal: Negative for myalgias, back pain and arthralgias.  Skin: Negative for rash.  Neurological: Negative for dizziness, syncope and headaches.  Hematological: Negative for adenopathy.  Psychiatric/Behavioral: Negative for confusion, disturbed wake/sleep cycle and dysphoric mood.       Objective:   Physical Exam  Constitutional: She is oriented to person,  place, and time. She appears well-developed and well-nourished.  HENT:  Head: Normocephalic and atraumatic.  Eyes: EOM are normal. Pupils are equal, round, and reactive to light.  Neck: Normal range of motion. Neck supple. No thyromegaly present.  Cardiovascular: Normal rate, regular rhythm and normal heart sounds.   No murmur heard. Pulmonary/Chest: Breath sounds normal. No respiratory distress. She has no wheezes. She has no rales.  Abdominal: Soft. Bowel sounds are normal. She exhibits no distension and no mass. There is no tenderness. There is no rebound and no guarding.  Musculoskeletal: Normal range of motion. She exhibits no edema.  Lymphadenopathy:    She has no cervical adenopathy.  Neurological: She is alert and oriented to person, place, and time. She displays normal reflexes. No cranial nerve deficit.  Skin: No rash noted.       No concerning skin lesions  Psychiatric: She has a normal mood and affect. Her behavior is normal. Judgment and thought content normal.          Assessment & Plan:  Healthy 41 year old female. Labs reviewed. Tetanus up-to-date.

## 2012-05-08 NOTE — Patient Instructions (Addendum)
Consider alternative steroid inhaler based on insurance coverage: Qvar, Pulmicort, Asmanex

## 2012-06-02 ENCOUNTER — Ambulatory Visit (HOSPITAL_COMMUNITY): Payer: 59 | Admitting: Psychiatry

## 2012-06-08 ENCOUNTER — Ambulatory Visit (INDEPENDENT_AMBULATORY_CARE_PROVIDER_SITE_OTHER): Payer: Self-pay | Admitting: Psychiatry

## 2012-06-08 DIAGNOSIS — F4320 Adjustment disorder, unspecified: Secondary | ICD-10-CM

## 2012-06-09 NOTE — Progress Notes (Signed)
   THERAPIST PROGRESS NOTE  Session Time: 4:00-5:00 pm  Participation Level: Active  Behavioral Response: Neat and Well GroomedAlertEuthymic  Type of Therapy: Embriatic Counseling  Treatment Goals addressed: Communication: Embryo Counseling  Interventions: Other: Education and Counseling   Summary: Tasha Brooks is a 41 y.o. female who attended the session with her husband. Both were referred by [facility] for one session of embryatric counseling. Patient and her husband stated they underwent two different IVF treatments and were successful in having three children (one daughter from the first treatment and twins from the second treatment). Both stated they have decided they are done growing their own family and want to donate four of their remaining embryos to a friend of a friend who is currently unable to have children of her own due to not currently being in a relationship of her own. Patients husband agreed to seminate the embryos which will be used in the IVF treatment. The counseling session was suggested by their doctor to ensure they were emotional prepared for this treatment. Both stated they had given a lot of thought to the process and were in agreement that they wanted to proceed with the process. Both were open and engaged in a discussion on both the benefits and challenges regarding the decision to donate. Discrepancies arose during the discussion regarding the level of openings and involvement they each wanted with the mom and possible children and they identified a need for further discussion regarding the amount of involvement they wanted with the children, including the level of involvement they wanted their three current children to have. Patient became tearful at certain points during the conversation when exploring difficult possibilities that could arise due to the complexity of the situation as she realized she already had some attachment to any "unborn  children". Both decided they were exploring questions for the first time that they had not considered and stated they were glad to have the opportunity to discuss this decision at a deeper level. Both agreed to go home and talk further and contact writer if they had more questions or wanted additional sessions.   Suicidal/Homicidal: Nowithout intent/plan  Therapist Response: Provided embryatric counseling, education and supportive therapy. Writer offered additional sessions as they move forward if they wanted additional support with the IVF process.   Plan: This was a one time counseling session. Patient agreed to Higher education careers adviser if she wants to resume sessions.   Diagnosis: Axis I: Adjustment Disorder NOS    Axis II: None    Stepheni Cameron E, LCSW 06/09/2012

## 2012-12-29 ENCOUNTER — Telehealth: Payer: Self-pay | Admitting: Family Medicine

## 2012-12-29 NOTE — Telephone Encounter (Signed)
Patient Information:  Caller Name: Mishika  Phone: [phone]  Patient: Tiahna, Cure  Gender: Female  DOB: 11/10/1970  Age: 42 Years  PCP: Evelena Peat (Family Practice)  Pregnant: No  Office Follow Up:  Does the office need to follow up with this patient?: Yes  Instructions For The Office: Please review, can contact patient at  [phone]   Symptoms  Reason For Call & Symptoms: Cough started Thurs 5/1, coughed up green mucus over the weekend.  Now dry hacking cough, has irritated thorat, yellow drainage down back of throat.  Not slept the last 2 nights due to cough  Reviewed Health History In EMR: Yes  Reviewed Medications In EMR: Yes  Reviewed Allergies In EMR: Yes  Reviewed Surgeries / Procedures: Yes  Date of Onset of Symptoms: 12/24/2012  Treatments Tried: cough drops, Niquil last night  Treatments Tried Worked: No OB / GYN:  LMP: 12/08/2012  Guideline(s) Used:  Cough  Disposition Per Guideline:   See Today or Tomorrow in Office  Reason For Disposition Reached:   Continuous (nonstop) coughing interferes with work or school and no improvement using cough treatment per Care Advice  Advice Given:  Reassurance  Coughing is the way that our lungs remove irritants and mucus. It helps protect our lungs from getting pneumonia.  You can get a dry hacking cough after a chest cold. Sometimes this type of cough can last 1-3 weeks, and be worse at night.  You can also get a cough after being exposed to irritating substances like smoke, strong perfumes, and dust.  Cough Medicines:  OTC Cough Drops: Cough drops can help a lot, especially for mild coughs. They reduce coughing by soothing your irritated throat and removing that tickle sensation in the back of the throat. Cough drops also have the advantage of portability - you can carry them with you.  Coughing Spasms:  Drink warm fluids. Inhale warm mist (Reason: both relax the airway and loosen up the phlegm).  Prevent  Dehydration:  Drink adequate liquids.  Patient Refused Recommendation:  Patient Requests Prescription  Declines Appt today 5/6, schedule too busy. Wanting Rx called to [facility]  [phone].

## 2012-12-29 NOTE — Telephone Encounter (Signed)
Pt will need OV per Dr Caryl Never, please inform pt, OK to see another provider if necessary.  Thank you

## 2012-12-29 NOTE — Telephone Encounter (Signed)
Spoke w/ Tasha Brooks regarding Dr. Lucie Leather instructions. Tasha Brooks states she is too busy to make appt today. States she will try more Nyquil tonight, and if not better will call tomorrow morning to be seen by someone. Encounter closed.

## 2013-04-13 ENCOUNTER — Other Ambulatory Visit: Payer: Self-pay | Admitting: Family Medicine

## 2013-04-14 ENCOUNTER — Telehealth: Payer: Self-pay | Admitting: Family Medicine

## 2013-04-14 NOTE — Telephone Encounter (Signed)
PT is calling to request a 3 month supply of her FLOVENT HFA 44 MCG/ACT inhaler, [facility] [address]. Please assist.

## 2013-04-14 NOTE — Telephone Encounter (Signed)
Patient rx is at the pharmacy and was sent on 04/12/13

## 2013-04-16 ENCOUNTER — Telehealth: Payer: Self-pay | Admitting: Family Medicine

## 2013-04-16 NOTE — Telephone Encounter (Signed)
Patient Information:  Caller Name: Emilio Aspen  Phone: [phone]  Patient: Tasha Brooks, Tasha Brooks  Gender: Female  DOB: October 15, 1970  Age: 42 Years  PCP: Evelena Peat [facility])  Pregnant: No  Office Follow Up:  Does the office need to follow up with this patient?: Yes  Instructions For The Office: Please review -- patient would like the refills sent to [facility] to be sent to [facility]  [phone]  RN Note:  [facility]  [phone].  Rx sent by office on 8/19 is ready for patient to pick up. Patient notified and will pick up Rx.  Would like refills from original Rx to be [facility] to [facility].  Symptoms  Reason For Call & Symptoms: Patient calling about Flovent Inhaler - recent prescription sent to [facility] for 3 inhalers.  Did not realize copay would be 360.00 so electing to get Rx locally per instructions of [facility] since will get one at a time.  Is currently out of Rx.  Reviewed Health History In EMR: Yes  Reviewed Medications In EMR: Yes  Reviewed Allergies In EMR: Yes  Reviewed Surgeries / Procedures: Yes  Date of Onset of Symptoms: 04/16/2013 OB / GYN:  LMP: Unknown  Guideline(s) Used:  No Protocol Available - Information Only  Disposition Per Guideline:   Home Care  Reason For Disposition Reached:   Information only question and nurse able to answer  Advice Given:  N/A  Patient Will Follow Care Advice:  YES

## 2013-04-19 NOTE — Telephone Encounter (Signed)
Pt has picked up rx at the pharmacy

## 2013-04-22 ENCOUNTER — Telehealth: Payer: Self-pay | Admitting: Family Medicine

## 2013-04-22 MED ORDER — FLUTICASONE PROPIONATE 50 MCG/ACT NA SUSP: 2.0000 | Freq: Every day | NASAL | Status: DC

## 2013-04-22 NOTE — Telephone Encounter (Signed)
Pt would like a new rx generic flonase [facility] [phone]

## 2013-04-22 NOTE — Telephone Encounter (Signed)
Change to Flonase 2 sprays per nostril once daily and refill for one year

## 2013-04-22 NOTE — Telephone Encounter (Signed)
Sent to [facility] order

## 2013-04-22 NOTE — Telephone Encounter (Signed)
Pt is on Nasonex but is wanting the Flonase because it has a generic brand

## 2013-05-18 ENCOUNTER — Ambulatory Visit: Payer: 59 | Admitting: Family

## 2013-05-18 ENCOUNTER — Ambulatory Visit (INDEPENDENT_AMBULATORY_CARE_PROVIDER_SITE_OTHER): Payer: 59 | Admitting: Family

## 2013-05-18 ENCOUNTER — Encounter: Payer: Self-pay | Admitting: Family

## 2013-05-18 VITALS — BP 96/60 | HR 64 | Temp 98.3°F | Wt 118.0 lb

## 2013-05-18 DIAGNOSIS — J309 Allergic rhinitis, unspecified: Secondary | ICD-10-CM

## 2013-05-18 DIAGNOSIS — J329 Chronic sinusitis, unspecified: Secondary | ICD-10-CM

## 2013-05-18 MED ORDER — AZITHROMYCIN 250 MG PO TABS: ORAL_TABLET | ORAL | Status: DC

## 2013-05-18 NOTE — Progress Notes (Signed)
Subjective:    Patient ID: Tasha Brooks, female    DOB: Mar 26, 1971, 42 y.o.   MRN: [id number]  HPI  42 year old white female, nonsmoker, patient of Dr. Caryl Never is in today with complaint of sneezing, cough, congestion, sinus pressure ongoing x2 weeks. She had been taken generic Claritin, NyQuil and DayQuil with no relief. However, she recently bought the brand name Claritin and feels like her symptoms are beginning to improve over the last day. She's also been using Flonase. Denies any fever, muscle aches or pain  Review of Systems  Constitutional: Negative.   HENT: Positive for congestion, rhinorrhea and postnasal drip.   Respiratory: Positive for cough. Negative for shortness of breath and wheezing.   Cardiovascular: Negative.   Skin: Negative.   Allergic/Immunologic: Positive for environmental allergies.  Neurological: Negative.   Hematological: Negative.   Psychiatric/Behavioral: Negative.    Past Medical History  Diagnosis Date  . ALLERGIC RHINITIS 07/11/2010  . Anemia   . Asthma     History   Social History  . Marital Status: Married    Spouse Name: N/A    Number of Children: N/A  . Years of Education: N/A   Occupational History  . Not on file.   Social History Main Topics  . Smoking status: Never Smoker   . Smokeless tobacco: Not on file  . Alcohol Use: Not on file  . Drug Use: Not on file  . Sexual Activity: Not on file   Other Topics Concern  . Not on file   Social History Narrative  . No narrative on file    Past Surgical History  Procedure Laterality Date  . Cesarean section      2008, 2011  . Breast enhancement surgery  1994    Family History  Problem Relation Age of Onset  . Stroke Father   . Hypertension Father   . Cancer Father     laryngeal    No Known Allergies  Current Outpatient Prescriptions on File Prior to Visit  Medication Sig Dispense Refill  . albuterol (PROVENTIL HFA;VENTOLIN HFA) 108 (90 BASE) MCG/ACT inhaler Inhale  2 puffs into the lungs every 6 (six) hours as needed.        . calcium-vitamin D 250-100 MG-UNIT per tablet Take 1 tablet by mouth 2 (two) times daily.        . fish oil-omega-3 fatty acids 1000 MG capsule Take 2 g by mouth daily.        Marland Kitchen FLOVENT HFA 44 MCG/ACT inhaler USE 2 INHALATIONS ORALLY   TWICE DAILY  31.8 g  3  . FLOVENT HFA 44 MCG/ACT inhaler INHALE 2 PUFFS TWICE A DAY FOR 14 DAYS  10.6 g  4  . fluticasone (FLONASE) 50 MCG/ACT nasal spray Place 2 sprays into the nose daily.  16 g  3  . JUNEL FE 1/20 1-20 MG-MCG tablet Take 1 tablet by mouth daily.       . mometasone (NASONEX) 50 MCG/ACT nasal spray Place 2 sprays into the nose daily.  17 g  3  . Multiple Vitamins-Minerals (WOMENS MULTI PO) Take by mouth daily.         No current facility-administered medications on file prior to visit.    BP 96/60  Pulse 64  Temp(Src) 98.3 F (36.8 C)  Wt 118 lb (53.524 kg)  BMI 20.24 kg/m2chart    Objective:   Physical Exam  Constitutional: She is oriented to person, place, and time. She appears well-developed and well-nourished.  HENT:  Right Ear: External ear normal.  Left Ear: External ear normal.  Pharynx with moderate erythema. Nasal turbinates pale and boggy  Neck: Normal range of motion. Neck supple.  Cardiovascular: Normal rate, regular rhythm and normal heart sounds.   Pulmonary/Chest: Effort normal and breath sounds normal.  Musculoskeletal: Normal range of motion.  Neurological: She is alert and oriented to person, place, and time.  Skin: Skin is warm and dry.  Psychiatric: She has a normal mood and affect.          Assessment & Plan:  Assessment: 1. Allergic rhinitis 2. Unlikely sinusitis  Plan: Continue Claritin-D and Flonase. If symptoms worsen over the next couple days then fill prescription for Z-Pak as directed. Call the office with any questions or concerns. Recheck as scheduled, and if needed.

## 2013-05-18 NOTE — Patient Instructions (Addendum)
Allergic Rhinitis Allergic rhinitis is when the mucous membranes in the nose respond to allergens. Allergens are particles in the air that cause your body to have an allergic reaction. This causes you to release allergic antibodies. Through a chain of events, these eventually cause you to release histamine into the blood stream (hence the use of antihistamines). Although meant to be protective to the body, it is this release that causes your discomfort, such as frequent sneezing, congestion and an itchy runny nose.  CAUSES  The pollen allergens may come from grasses, trees, and weeds. This is seasonal allergic rhinitis, or "hay fever." Other allergens cause year-round allergic rhinitis (perennial allergic rhinitis) such as house dust mite allergen, pet dander and mold spores.  SYMPTOMS   Nasal stuffiness (congestion).  Runny, itchy nose with sneezing and tearing of the eyes.  There is often an itching of the mouth, eyes and ears. It cannot be cured, but it can be controlled with medications. DIAGNOSIS  If you are unable to determine the offending allergen, skin or blood testing may find it. TREATMENT   Avoid the allergen.  Medications and allergy shots (immunotherapy) can help.  Hay fever may often be treated with antihistamines in pill or nasal spray forms. Antihistamines block the effects of histamine. There are over-the-counter medicines that may help with nasal congestion and swelling around the eyes. Check with your caregiver before taking or giving this medicine. If the treatment above does not work, there are many new medications your caregiver can prescribe. Stronger medications may be used if initial measures are ineffective. Desensitizing injections can be used if medications and avoidance fails. Desensitization is when a patient is given ongoing shots until the body becomes less sensitive to the allergen. Make sure you follow up with your caregiver if problems continue. SEEK MEDICAL  CARE IF:   You develop fever (more than 100.5 F (38.1 C).  You develop a cough that does not stop easily (persistent).  You have shortness of breath.  You start wheezing.  Symptoms interfere with normal daily activities. Document Released: 05/07/2001 Document Revised: 11/04/2011 Document Reviewed: 11/16/2008 Exit[employer] Patient Information 2014 [employer].  

## 2013-06-01 ENCOUNTER — Telehealth: Payer: Self-pay | Admitting: Family Medicine

## 2013-06-01 MED ORDER — ALBUTEROL SULFATE HFA 108 (90 BASE) MCG/ACT IN AERS: 2.0000 | INHALATION_SPRAY | Freq: Four times a day (QID) | RESPIRATORY_TRACT | Status: DC | PRN

## 2013-06-01 NOTE — Telephone Encounter (Signed)
Pt states that her rx of albuterol (PROVENTIL HFA;VENTOLIN HFA) 108 (90 BASE) MCG/ACT inhaler has expired. She would like a new [facility]. Please assist.

## 2013-06-01 NOTE — Telephone Encounter (Signed)
RX sent to pharmacy  

## 2013-06-10 ENCOUNTER — Telehealth: Payer: Self-pay | Admitting: Family Medicine

## 2013-06-10 MED ORDER — ALBUTEROL SULFATE HFA 108 (90 BASE) MCG/ACT IN AERS: 2.0000 | INHALATION_SPRAY | Freq: Four times a day (QID) | RESPIRATORY_TRACT | Status: DC | PRN

## 2013-06-10 NOTE — Telephone Encounter (Signed)
RX sent to [facility]

## 2013-06-10 NOTE — Telephone Encounter (Signed)
Pt states that she is having trouble receiving her albuterol (PROVENTIL HFA;VENTOLIN HFA) 108 (90 BASE) MCG/ACT inhaler RX [facility]. She is requesting that we send a 1 month supply to [facility] on [address]. Please advise.

## 2013-08-03 ENCOUNTER — Telehealth: Payer: Self-pay | Admitting: Family Medicine

## 2013-08-03 NOTE — Telephone Encounter (Signed)
Pt would like a z pac for a cough that she has obtained after a cold.  Pt states that she is having the same symptoms as the last time she saw Padonda on 9/23.  Pt has taken mucinex and delsum and they did not work. Pt is unable to come in and be seen, she is traveling back and forth for work.  Please advise.

## 2013-08-03 NOTE — Telephone Encounter (Signed)
Pt scheduled appt 12/10 4pm

## 2013-08-04 ENCOUNTER — Encounter: Payer: Self-pay | Admitting: Family Medicine

## 2013-08-04 ENCOUNTER — Ambulatory Visit (INDEPENDENT_AMBULATORY_CARE_PROVIDER_SITE_OTHER): Payer: 59 | Admitting: Family Medicine

## 2013-08-04 VITALS — BP 110/64 | HR 90 | Temp 98.3°F | Wt 120.0 lb

## 2013-08-04 DIAGNOSIS — R059 Cough, unspecified: Secondary | ICD-10-CM

## 2013-08-04 DIAGNOSIS — R05 Cough: Secondary | ICD-10-CM

## 2013-08-04 MED ORDER — AZITHROMYCIN 250 MG PO TABS: ORAL_TABLET | ORAL | Status: AC

## 2013-08-04 NOTE — Progress Notes (Signed)
   Subjective:    Patient ID: Tasha Brooks, female    DOB: May 23, 1971, 42 y.o.   MRN: [id number]  HPI Patient is nonsmoker seen with 2 week history of cough She does have history of asthma which has been treated with Flovent. She normally takes one inhalation daily and recently started twice daily. Cough has been productive of colored sputum mostly early morning. She's tried over-the-counter medication such as Mucinex DM Claritin-D with minimal relief. Denies any fever. No dyspnea. No pleuritic pain. No headaches. Has had some sinus congestion. No sick contacts.  She does not have any dyspnea and is not aware of any obvious wheezing. She's still been exercising some this week though she has curtailed her normal level of exercise.  Past Medical History  Diagnosis Date  . ALLERGIC RHINITIS 07/11/2010  . Anemia   . Asthma    Past Surgical History  Procedure Laterality Date  . Cesarean section      2008, 2011  . Breast enhancement surgery  1994    reports that she has never smoked. She does not have any smokeless tobacco history on file. Her alcohol and drug histories are not on file. family history includes Cancer in her father; Hypertension in her father; Stroke in her father. No Known Allergies    Review of Systems  Constitutional: Negative for fever and chills.  HENT: Positive for congestion. Negative for sore throat.   Respiratory: Positive for cough. Negative for shortness of breath and wheezing.        Objective:   Physical Exam  Constitutional: She appears well-developed and well-nourished. No distress.  HENT:  Right Ear: External ear normal.  Left Ear: External ear normal.  Mouth/Throat: Oropharynx is clear and moist.  Neck: Neck supple.  Cardiovascular: Normal rate.   Pulmonary/Chest: Effort normal and breath sounds normal. No respiratory distress. She has no wheezes. She has no rales.  Lymphadenopathy:    She has no cervical adenopathy.            Assessment & Plan:  Cough. Probably acute viral bronchitis. Nonfocal exam. No audible wheezing. Pulse oximetry 98%. Continue Flovent. Wrote for Zithromax to start only if she has any persistent productive cough worse develops any fever or worsening symptoms. Continue Mucinex and adequate hydration and when necessary albuterol

## 2013-08-04 NOTE — Progress Notes (Signed)
Pre visit review using our clinic review tool, if applicable. No additional management support is needed unless otherwise documented below in the visit note. 

## 2013-08-20 ENCOUNTER — Ambulatory Visit (INDEPENDENT_AMBULATORY_CARE_PROVIDER_SITE_OTHER): Payer: 59 | Admitting: Family Medicine

## 2013-08-20 ENCOUNTER — Encounter: Payer: Self-pay | Admitting: Family Medicine

## 2013-08-20 VITALS — BP 100/60 | HR 95 | Temp 98.5°F | Wt 118.0 lb

## 2013-08-20 DIAGNOSIS — R509 Fever, unspecified: Secondary | ICD-10-CM

## 2013-08-20 NOTE — Progress Notes (Signed)
Pre visit review using our clinic review tool, if applicable. No additional management support is needed unless otherwise documented below in the visit note. 

## 2013-08-20 NOTE — Progress Notes (Signed)
   Subjective:    Patient ID: Tasha Brooks, female    DOB: April 20, 1971, 42 y.o.   MRN: [id number]  HPI Acute visit. Patient had recent cough and just started Zithromax on Monday. Starting 2 days ago she had relatively acute bodyaches, fever, increase fatigue and cough. Her fever was up to 101. Both her children and her husband had somewhat similar symptoms. They had more diarrhea and vomiting. All of them had flu vaccination.  Tasha does have history of asthma but takes Flovent regularly. No dyspnea  Past Medical History  Diagnosis Date  . ALLERGIC RHINITIS 07/11/2010  . Anemia   . Asthma    Past Surgical History  Procedure Laterality Date  . Cesarean section      2008, 2011  . Breast enhancement surgery  1994    reports that she has never smoked. She does not have any smokeless tobacco history on file. Her alcohol and drug histories are not on file. family history includes Cancer in her father; Hypertension in her father; Stroke in her father. No Known Allergies    Review of Systems  Constitutional: Positive for fever, chills and fatigue.  HENT: Positive for sore throat.   Respiratory: Positive for cough.   Musculoskeletal: Positive for myalgias.  Neurological: Positive for headaches.       Objective:   Physical Exam  Constitutional: She appears well-developed and well-nourished.  HENT:  Right Ear: External ear normal.  Left Ear: External ear normal.  Mouth/Throat: Oropharynx is clear and moist.  Neck: Neck supple.  Cardiovascular: Normal rate.   Pulmonary/Chest: Effort normal and breath sounds normal. No respiratory distress. She has no wheezes. She has no rales.  Lymphadenopathy:    She has no cervical adenopathy.          Assessment & Plan:  Probable viral illness. Rule out influenza. Rapid influenza screen obtained. If positive, treat with Tamiflu. If negative treat symptomatically Influenza screen negative. Treat symptomatically. Finish out  [employer].

## 2013-08-20 NOTE — Patient Instructions (Signed)

## 2013-09-14 ENCOUNTER — Other Ambulatory Visit: Payer: Self-pay | Admitting: Family Medicine

## 2013-12-17 ENCOUNTER — Ambulatory Visit (INDEPENDENT_AMBULATORY_CARE_PROVIDER_SITE_OTHER): Payer: 59 | Admitting: Family Medicine

## 2013-12-17 ENCOUNTER — Telehealth: Payer: Self-pay | Admitting: Family Medicine

## 2013-12-17 ENCOUNTER — Encounter: Payer: Self-pay | Admitting: Family Medicine

## 2013-12-17 VITALS — BP 87/53 | HR 84 | Temp 99.1°F | Ht 64.0 in | Wt 119.0 lb

## 2013-12-17 DIAGNOSIS — J019 Acute sinusitis, unspecified: Secondary | ICD-10-CM

## 2013-12-17 DIAGNOSIS — J45909 Unspecified asthma, uncomplicated: Secondary | ICD-10-CM

## 2013-12-17 MED ORDER — METHYLPREDNISOLONE ACETATE 80 MG/ML IJ SUSP
120.0000 mg | Freq: Once | INTRAMUSCULAR | Status: AC
Start: 2013-12-17 — End: 2013-12-17
  Administered 2013-12-17: 120 mg via INTRAMUSCULAR

## 2013-12-17 MED ORDER — AZITHROMYCIN 250 MG PO TABS: ORAL_TABLET | ORAL | Status: DC

## 2013-12-17 NOTE — Telephone Encounter (Signed)
Noted  

## 2013-12-17 NOTE — Addendum Note (Signed)
Addended by: Aniceto BossNIMMONS, SYLVIA A on: 12/17/2013 02:58 PM   Modules accepted: Orders

## 2013-12-17 NOTE — Telephone Encounter (Signed)
Patient Information:  Caller Name: Donetta PottsSteffany  Phone: [phone]  Patient: Tasha Brooks, Tasha Brooks  Gender: Female  DOB: March 31, 1971  Age: 43 Years  PCP: Evelena PeatBurchette, [facility](Family Practice)  Pregnant: No  Office Follow Up:  Does the office need to follow up with this patient?: No  Instructions For The Office: N/A  RN Note:  Care advice and call back parameters reviewed. Appt scheduled for patient around her work scheduled. Understanding expressed.  Symptoms  Reason For Call & Symptoms: Patient states she has noted sore throat since Tuesday 12/14/13.  She states the throat is red. Denies fever. Exposed to strep a from chlldren last month.  +cough ongoing since spring (1-2 months) off/on and she has been doing Flovent, Claritin Brooks, Nasonex. Yellow productive.  History of Asthma and Allergies.  She is able to swallow.  Reviewed Health History In EMR: Yes  Reviewed Medications In EMR: Yes  Reviewed Allergies In EMR: Yes  Reviewed Surgeries / Procedures: Yes  Date of Onset of Symptoms: 12/14/2013  Treatments Tried: Ibuprofen, claritin Brooks, Cepacol throat spray, flovent, Nasonex.  Treatments Tried Worked: No OB / GYN:  LMP: 12/10/2013  Guideline(s) Used:  Sore Throat  Disposition Per Guideline:   Strep Test Only Visit Today or Tomorrow  Reason For Disposition Reached:   Sore throat is the main symptom and persists > 48 hours  Advice Given:  Pain Medicines:  For pain relief, you can take either acetaminophen, ibuprofen, or naproxen.  They are over-the-counter (OTC) pain drugs. You can buy them at the drugstore.  For Relief of Sore Throat Pain:  Sip warm chicken broth or apple juice.  Suck on hard candy or a throat lozenge (over-the-counter).  Gargle warm salt water 3 times daily (1 teaspoon of salt in 8 oz or 240 ml of warm water).  Soft Diet:   Cold drinks and milk shakes are especially good (Reason: swollen tonsils can make some foods hard to swallow).  Call Back If:  Fever lasts  longer than 3 days  You become worse.  Patient Will Follow Care Advice:  YES  Appointment Scheduled:  12/17/2013 13:30:00 Appointment Scheduled Provider:  Gershon CraneFry, [facility](Family Practice)

## 2013-12-17 NOTE — Progress Notes (Signed)
   Subjective:    Patient ID: Lennox GrumblesSteffany Asberry, female    DOB: 04-Feb-1971, 43 y.o.   MRN: [id number]  HPI Here for 5 days of stuffy head, PND, ST, and a dry cough. Her children were treated for strep throats about 2 weeks ago and they are fine now.    Review of Systems  Constitutional: Negative.   HENT: Positive for congestion, postnasal drip, sinus pressure, sore throat and trouble swallowing.   Eyes: Negative.   Respiratory: Positive for cough and chest tightness.   Cardiovascular: Negative.        Objective:   Physical Exam  Constitutional: She appears well-developed and well-nourished.  HENT:  Right Ear: External ear normal.  Left Ear: External ear normal.  Nose: Nose normal.  Mouth/Throat: No oropharyngeal exudate.  Posterior OP red without exudate   Eyes: Conjunctivae are normal.  Pulmonary/Chest: Effort normal and breath sounds normal. No respiratory distress. She has no rales.  Soft scattered wheezes   Lymphadenopathy:    She has no cervical adenopathy.          Assessment & Plan:  Given a steroid shot and a Zpack, drink fluids. Recheck prn

## 2013-12-17 NOTE — Progress Notes (Signed)
Pre visit review using our clinic review tool, if applicable. No additional management support is needed unless otherwise documented below in the visit note. 

## 2014-02-28 ENCOUNTER — Telehealth: Payer: Self-pay | Admitting: Family Medicine

## 2014-02-28 MED ORDER — ALBUTEROL SULFATE HFA 108 (90 BASE) MCG/ACT IN AERS: 2.0000 | INHALATION_SPRAY | Freq: Four times a day (QID) | RESPIRATORY_TRACT | Status: DC | PRN

## 2014-02-28 NOTE — Telephone Encounter (Signed)
Pt is requesting refill albuterol (PROVENTIL HFA;VENTOLIN HFA) 108 (90 BASE) MCG/ACT inhaler pls [facility] Pt [facility] [address]

## 2014-02-28 NOTE — Telephone Encounter (Signed)
Rx sent to mail order and pharmacy  

## 2014-03-02 ENCOUNTER — Telehealth: Payer: Self-pay

## 2014-03-02 NOTE — Telephone Encounter (Signed)
Yes

## 2014-03-02 NOTE — Telephone Encounter (Signed)
Proventil inhaler is not covered the participants formulary. Formulary suggestions are ProAir HFA.  Is it okay to change?

## 2014-03-03 MED ORDER — ALBUTEROL SULFATE HFA 108 (90 BASE) MCG/ACT IN AERS: 2.0000 | INHALATION_SPRAY | Freq: Four times a day (QID) | RESPIRATORY_TRACT | Status: AC | PRN

## 2014-03-03 NOTE — Telephone Encounter (Signed)
Rx sent to [facility]

## 2014-03-07 ENCOUNTER — Other Ambulatory Visit: Payer: Self-pay | Admitting: Family Medicine

## 2014-03-12 ENCOUNTER — Ambulatory Visit (INDEPENDENT_AMBULATORY_CARE_PROVIDER_SITE_OTHER): Payer: 59 | Admitting: Family

## 2014-03-12 VITALS — BP 100/62 | Temp 97.8°F | Wt 115.0 lb

## 2014-03-12 DIAGNOSIS — H109 Unspecified conjunctivitis: Secondary | ICD-10-CM

## 2014-03-12 MED ORDER — NEOMYCIN-POLYMYXIN-HC 3.5-10000-1 OP SUSP: 3.0000 [drp] | Freq: Four times a day (QID) | OPHTHALMIC | Status: DC

## 2014-03-12 NOTE — Progress Notes (Signed)
Subjective:    Patient ID: Tasha GrumblesSteffany Brooks, female    DOB: 11-25-1970, 43 y.o.   MRN: [id number]  HPI  [name] is a 43 yr old female who presents a 4 day history of eye irritation and crusting.  Using Mofexa that she had on hand from her 43 year old. Started Mofexa 2 days ago with some improvement, but has now run out and needs refill. Wears contacts- has been trying to leave them out but does not own prescription eye glasses.  No redness.    Review of Systems See HPI  Past Medical History  Diagnosis Date  . ALLERGIC RHINITIS 07/11/2010  . Anemia   . Asthma   . Allergy     History   Social History  . Marital Status: Married    Spouse Name: N/A    Number of Children: N/A  . Years of Education: N/A   Occupational History  . Not on file.   Social History Main Topics  . Smoking status: Never Smoker   . Smokeless tobacco: Never Used  . Alcohol Use: Yes     Comment: twice a week  . Drug Use: No  . Sexual Activity: Not on file   Other Topics Concern  . Not on file   Social History Narrative  . No narrative on file    Past Surgical History  Procedure Laterality Date  . Cesarean section      2008, 2011  . Breast enhancement surgery  1994    Family History  Problem Relation Age of Onset  . Stroke Father   . Hypertension Father   . Cancer Father     laryngeal    No Known Allergies  Current Outpatient Prescriptions on File Prior to Visit  Medication Sig Dispense Refill  . albuterol (PROVENTIL HFA;VENTOLIN HFA) 108 (90 BASE) MCG/ACT inhaler Inhale 2 puffs into the lungs every 6 (six) hours as needed.  8.5 g  5  . azithromycin (ZITHROMAX) 250 MG tablet As directed  6 tablet  0  . calcium-vitamin D 250-100 MG-UNIT per tablet Take 1 tablet by mouth 2 (two) times daily.        . fish oil-omega-3 fatty acids 1000 MG capsule Take 2 g by mouth daily.        . fluticasone (FLONASE) 50 MCG/ACT nasal spray USE 2 SPRAYS NASALLY DAILY  16 g  3  . fluticasone (FLONASE)  50 MCG/ACT nasal spray USE 2 SPRAYS NASALLY DAILY  16 g  3  . JUNEL FE 1/20 1-20 MG-MCG tablet Take 1 tablet by mouth daily.       . mometasone (NASONEX) 50 MCG/ACT nasal spray Place 2 sprays into the nose daily.  17 g  3  . Multiple Vitamins-Minerals (WOMENS MULTI PO) Take by mouth daily.         No current facility-administered medications on file prior to visit.    BP 100/62  Temp(Src) 97.8 F (36.6 C) (Oral)  Wt 115 lb (52.164 kg)       Objective:   Physical Exam  Constitutional: She appears well-developed and well-nourished. No distress.  HENT:  Head: Normocephalic and atraumatic.  Eyes: Right eye exhibits no discharge. Left eye exhibits no discharge.  Mild scleral irritations noted.  PERRLA  Cardiovascular: Normal rate and regular rhythm.   No murmur heard. Pulmonary/Chest: Effort normal and breath sounds normal. No respiratory distress. She has no wheezes. She has no rales. She exhibits no tenderness.  Assessment & Plan:

## 2014-03-12 NOTE — Patient Instructions (Signed)

## 2014-03-13 DIAGNOSIS — H109 Unspecified conjunctivitis: Secondary | ICD-10-CM | POA: Insufficient documentation

## 2014-03-13 NOTE — Assessment & Plan Note (Signed)
Pt noted mofexa copay was high. Will rx with cortisporin opthalmic drops. Advised pt to wait until symptoms resolved prior to using contact lenses.

## 2014-03-16 ENCOUNTER — Telehealth: Payer: Self-pay | Admitting: *Deleted

## 2014-03-16 MED ORDER — NEOMYCIN-POLYMYXIN-HC 3.5-10000-1 OP SUSP: OPHTHALMIC | Status: DC

## 2014-03-16 NOTE — Telephone Encounter (Signed)
Received message from [facility] requesting verification of dosage form and drug or Cortisporin Rx from 03/12/14. Current directions are 3 drops into both eyes four times a day. They want to verify if this is for the eyes or ears? Also, did pt want this to go to mail order?

## 2014-03-16 NOTE — Telephone Encounter (Signed)
Left message for pt to return my call.  Called [facility] [phone], REF# [id number]. Spoke with Boneta LucksJenny and cancelled Rx with mail order.

## 2014-03-16 NOTE — Telephone Encounter (Signed)
I have resent to [facility].  Please cancel at mail order.

## 2014-03-17 ENCOUNTER — Telehealth: Payer: Self-pay | Admitting: Family Medicine

## 2014-03-17 MED ORDER — FLUTICASONE PROPIONATE 50 MCG/ACT NA SUSP: NASAL | Status: DC

## 2014-03-17 NOTE — Telephone Encounter (Signed)
RX sent to pharmacy  

## 2014-03-17 NOTE — Telephone Encounter (Signed)
Pt would like fluticasone (FLOVENT HFA) 44 MCG/ACT inhaler [facility] [address]

## 2014-03-18 MED ORDER — FLUTICASONE PROPIONATE HFA 44 MCG/ACT IN AERO: 2.0000 | INHALATION_SPRAY | Freq: Two times a day (BID) | RESPIRATORY_TRACT | Status: AC

## 2014-03-18 NOTE — Telephone Encounter (Signed)
Patient calling back to report the wrong medication was sent to the pharmacy.  (FLOVENT HFA) 44 MCG/ACT should have been sent instead of Flonase.  [facility] send [facility] [address].

## 2014-03-18 NOTE — Telephone Encounter (Signed)
Sent new RX to pharmacy

## 2014-03-18 NOTE — Addendum Note (Signed)
Addended by: Thomasena EdisFLOYD, Violet Seabury E on: 03/18/2014 08:28 AM   Modules accepted: Orders

## 2014-05-24 ENCOUNTER — Other Ambulatory Visit: Payer: Self-pay | Admitting: Family Medicine

## 2014-06-27 ENCOUNTER — Ambulatory Visit (INDEPENDENT_AMBULATORY_CARE_PROVIDER_SITE_OTHER): Payer: 59 | Admitting: *Deleted

## 2014-06-27 ENCOUNTER — Ambulatory Visit: Payer: 59 | Admitting: *Deleted

## 2014-06-27 DIAGNOSIS — Z23 Encounter for immunization: Secondary | ICD-10-CM

## 2014-07-01 ENCOUNTER — Other Ambulatory Visit: Payer: Self-pay | Admitting: Allergy

## 2014-07-01 ENCOUNTER — Ambulatory Visit
Admission: RE | Admit: 2014-07-01 | Discharge: 2014-07-01 | Disposition: A | Discharge status: Home / Self Care | Payer: 59 | Source: Ambulatory Visit | Attending: Allergy | Admitting: Allergy

## 2014-07-01 DIAGNOSIS — J4540 Moderate persistent asthma, uncomplicated: Secondary | ICD-10-CM

## 2014-08-01 ENCOUNTER — Encounter: Payer: Self-pay | Admitting: Family Medicine

## 2014-08-01 ENCOUNTER — Ambulatory Visit (INDEPENDENT_AMBULATORY_CARE_PROVIDER_SITE_OTHER): Payer: Self-pay | Admitting: Family Medicine

## 2014-08-01 VITALS — BP 112/80 | HR 74 | Temp 99.3°F | Ht 64.0 in | Wt 115.0 lb

## 2014-08-01 DIAGNOSIS — J209 Acute bronchitis, unspecified: Secondary | ICD-10-CM

## 2014-08-01 DIAGNOSIS — J988 Other specified respiratory disorders: Secondary | ICD-10-CM

## 2014-08-01 MED ORDER — AZITHROMYCIN 250 MG PO TABS: ORAL_TABLET | ORAL | Status: AC

## 2014-08-01 NOTE — Progress Notes (Signed)
Pre visit review using our clinic review tool, if applicable. No additional management support is needed unless otherwise documented below in the visit note. 

## 2014-08-01 NOTE — Progress Notes (Signed)
HPI:  -started: 3-4 days ago -symptoms:nasal congestion, low grade feverssore throat, cough, mild SOB but not much - increased mucus [rpduction, body aches, has not used alb more -denies:fever, SOB, NVD, tooth pain -has tried: nothing -sick contacts/travel/risks: denies flu exposure, tick exposure or or Ebola risks - boys had strep last week -Hx of: allergies and asthma ROS: See pertinent positives and negatives per HPI.  Past Medical History  Diagnosis Date  . ALLERGIC RHINITIS 07/11/2010  . Anemia   . Asthma   . Allergy     Past Surgical History  Procedure Laterality Date  . Cesarean section      2008, 2011  . Breast enhancement surgery  1994    Family History  Problem Relation Age of Onset  . Stroke Father   . Hypertension Father   . Cancer Father     laryngeal    History   Social History  . Marital Status: Married    Spouse Name: N/A    Number of Children: N/A  . Years of Education: N/A   Social History Main Topics  . Smoking status: Never Smoker   . Smokeless tobacco: Never Used  . Alcohol Use: Yes     Comment: twice a week  . Drug Use: No  . Sexual Activity: None   Other Topics Concern  . None   Social History Narrative    Current outpatient prescriptions: albuterol (PROVENTIL HFA;VENTOLIN HFA) 108 (90 BASE) MCG/ACT inhaler, Inhale 2 puffs into the lungs every 6 (six) hours as needed., Disp: 8.5 g, Rfl: 5;  Ascorbic Acid (VITAMIN C PO), Take by mouth daily., Disp: , Rfl: ;  budesonide-formoterol (SYMBICORT) 160-4.5 MCG/ACT inhaler, Inhale 2 puffs into the lungs 2 (two) times daily., Disp: , Rfl:  calcium-vitamin D 250-100 MG-UNIT per tablet, Take 1 tablet by mouth 2 (two) times daily.  , Disp: , Rfl: ;  FLOVENT HFA 44 MCG/ACT inhaler, INHALE 2 PUFFS BY MOUTH TWICE A DAY FOR 14 DAYS, Disp: 10.6 g, Rfl: 4;  fluticasone (FLOVENT HFA) 44 MCG/ACT inhaler, Inhale 2 puffs into the lungs 2 (two) times daily., Disp: 1 Inhaler, Rfl: 3;  JUNEL FE  1/20 1-20 MG-MCG tablet, Take 1 tablet by mouth daily. , Disp: , Rfl:  mometasone (NASONEX) 50 MCG/ACT nasal spray, Place 2 sprays into the nose daily., Disp: 17 g, Rfl: 3;  Multiple Vitamins-Minerals (WOMENS MULTI PO), Take by mouth daily.  , Disp: , Rfl: ;  azithromycin (ZITHROMAX) 250 MG tablet, 2 TABS ON FIRST DAY THEN 1 TAB DAILY, Disp: 6 tablet, Rfl: 0  EXAM:  Filed Vitals:   08/01/14 1339  BP: 112/80  Pulse: 74  Temp: 99.3 F (37.4 C)    Body mass index is 19.73 kg/(m^2).  GENERAL: vitals reviewed and listed above, alert, oriented, appears well hydrated and in no acute distress  HEENT: atraumatic, conjunttiva clear, no obvious abnormalities on inspection of external nose and ears, normal appearance of ear canals and TMs, clear nasal congestion, mild post oropharyngeal erythema with PND, no tonsillar edema or exudate, no sinus TTP  NECK: no obvious masses on inspection  LUNGS: clear to auscultation bilaterally, no wheezes, rales or rhonchi, good air movement  CV: HRRR, no peripheral edema  MS: moves all extremities without noticeable abnormality  PSYCH: pleasant and cooperative, no obvious depression or anxiety  ASSESSMENT AND PLAN:  Discussed the following assessment and plan:  Acute bronchitis, unspecified organism - Plan: azithromycin (ZITHROMAX) 250 MG tablet  Respiratory infection - Plan:  azithromycin (ZITHROMAX) 250 MG tablet  We discussed potential etiologies, with VURI being most likely, and advised supportive care and monitoring. Offered testing for strep and flu - she declined and wanted zpack which would cover resp org and strep. Discussed that if viral this may not help. Discussed prednisone but opted to hold for now. We discussed treatment side effects, likely course, antibiotic misuse, transmission, and signs of developing a serious illness. -of course, we advised to return or notify a doctor immediately if symptoms worsen or persist or new concerns  arise.    There are no Patient Instructions on file for this visit.   Kriste BasqueKIM, HANNAH R.

## 2016-06-05 IMAGING — CR DG CHEST 2V
2 series · 2 of 2 positions shown · non-contrast
Comparison: PA and lateral chest x-ray February 22, 2009

CLINICAL DATA: Asthma with persistent cough; no history of tobacco
use

EXAM:
CHEST  2 VIEW

[view not recorded (1 of 2)]
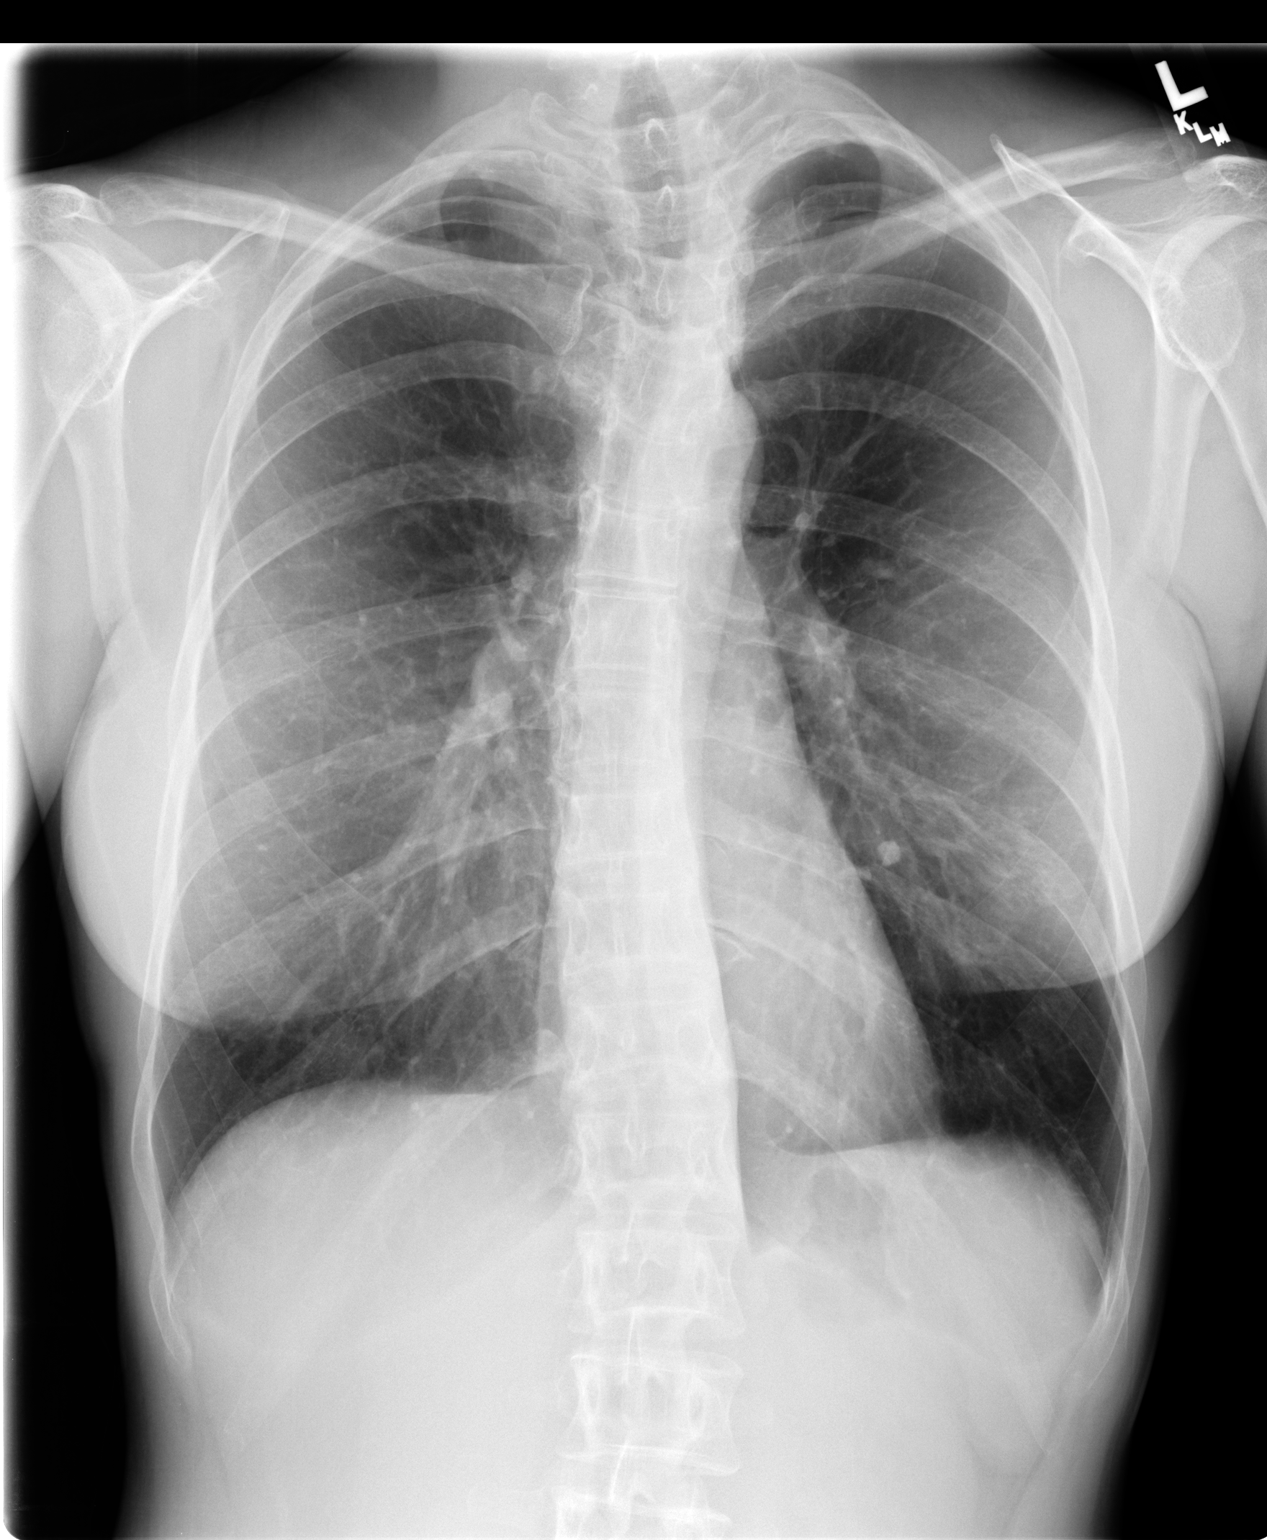

[view not recorded (2 of 2)]
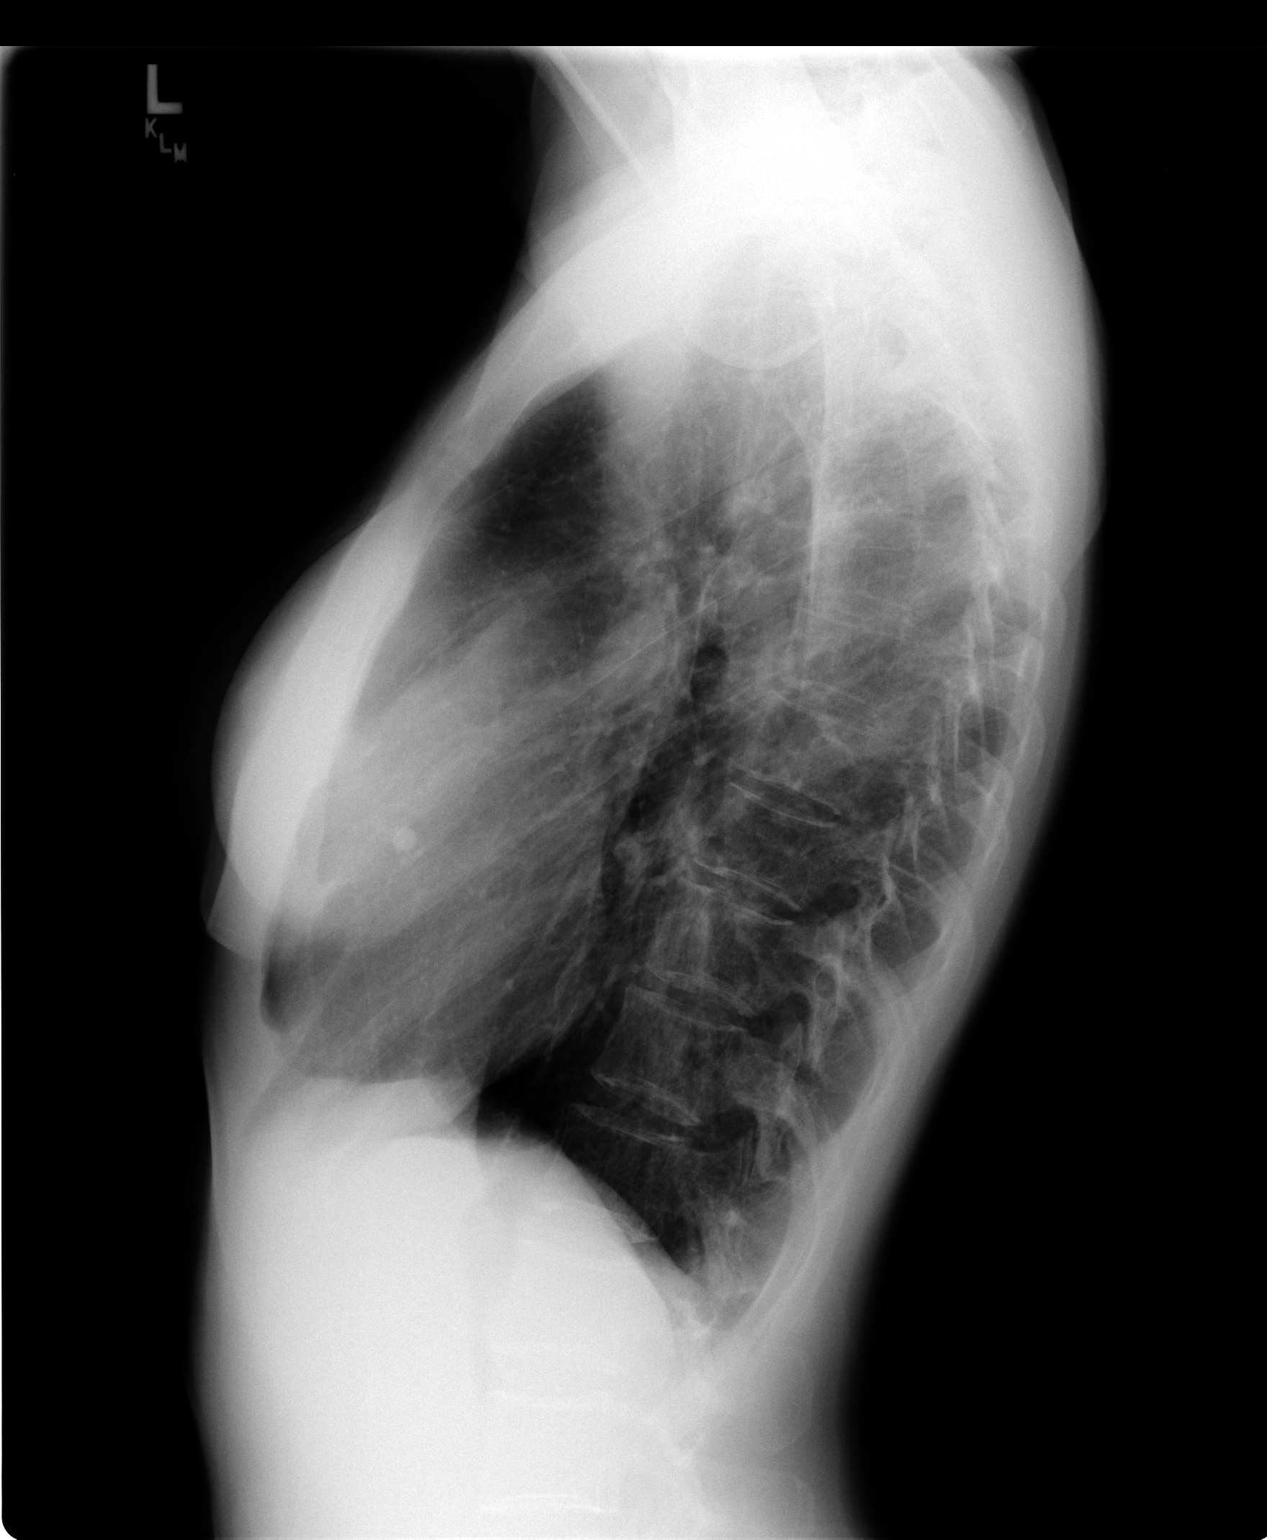

[2 of 2 positions shown; findings below may reference images not displayed]

FINDINGS: The lungs are mildly hyperinflated. There is no focal infiltrate.
There is a stable 8 mm diameter calcified nodule in the inferior
aspect of the left upper lobe. The heart and pulmonary vascularity
are normal. There is no pleural effusion or pneumothorax. There is
stable mid thoracic dextroscoliosis.
IMPRESSION: Hyperinflation consistent with known asthma. There is evidence of
previous granulomatous infection. There is no evidence of pneumonia
nor other acute cardiopulmonary abnormality.
